# Patient Record
Sex: Female | Born: 1960 | Race: White | Hispanic: No | Marital: Married | Smoking: Never smoker
Health system: Southern US, Community
[De-identification: ages and names within clinical notes are randomized; demographics above are authoritative.]

## PROBLEM LIST (undated history)

## (undated) DIAGNOSIS — N950 Postmenopausal bleeding: Secondary | ICD-10-CM

## (undated) DIAGNOSIS — L719 Rosacea, unspecified: Secondary | ICD-10-CM

## (undated) DIAGNOSIS — L570 Actinic keratosis: Secondary | ICD-10-CM

## (undated) DIAGNOSIS — Q445 Other congenital malformations of bile ducts: Secondary | ICD-10-CM

## (undated) DIAGNOSIS — K828 Other specified diseases of gallbladder: Secondary | ICD-10-CM

## (undated) DIAGNOSIS — R9389 Abnormal findings on diagnostic imaging of other specified body structures: Secondary | ICD-10-CM

## (undated) DIAGNOSIS — E079 Disorder of thyroid, unspecified: Secondary | ICD-10-CM

## (undated) DIAGNOSIS — K648 Other hemorrhoids: Secondary | ICD-10-CM

## (undated) DIAGNOSIS — E039 Hypothyroidism, unspecified: Secondary | ICD-10-CM

## (undated) DIAGNOSIS — Z973 Presence of spectacles and contact lenses: Secondary | ICD-10-CM

## (undated) DIAGNOSIS — M8580 Other specified disorders of bone density and structure, unspecified site: Secondary | ICD-10-CM

## (undated) DIAGNOSIS — R011 Cardiac murmur, unspecified: Secondary | ICD-10-CM

## (undated) DIAGNOSIS — N809 Endometriosis, unspecified: Secondary | ICD-10-CM

## (undated) DIAGNOSIS — E785 Hyperlipidemia, unspecified: Secondary | ICD-10-CM

## (undated) HISTORY — DX: Actinic keratosis: L57.0

## (undated) HISTORY — PX: FRACTURE SURGERY: SHX138

## (undated) HISTORY — DX: Other specified disorders of bone density and structure, unspecified site: M85.80

## (undated) HISTORY — DX: Endometriosis, unspecified: N80.9

## (undated) HISTORY — PX: OOPHORECTOMY: SHX86

## (undated) HISTORY — DX: Other hemorrhoids: K64.8

## (undated) HISTORY — DX: Other specified diseases of gallbladder: K82.8

## (undated) HISTORY — DX: Hypothyroidism, unspecified: E03.9

## (undated) HISTORY — DX: Cardiac murmur, unspecified: R01.1

## (undated) HISTORY — PX: APPENDECTOMY: SHX54

## (undated) HISTORY — PX: BREAST CYST ASPIRATION: SHX578

## (undated) HISTORY — PX: LAPAROSCOPIC SALPINGO OOPHERECTOMY: SHX5927

## (undated) HISTORY — DX: Disorder of thyroid, unspecified: E07.9

## (undated) HISTORY — DX: Hyperlipidemia, unspecified: E78.5

## (undated) SURGERY — Surgical Case
Anesthesia: *Unknown

---

## 2000-11-06 HISTORY — PX: LAPAROSCOPIC SALPINGO OOPHERECTOMY: SHX5927

## 2010-02-20 ENCOUNTER — Emergency Department: Payer: Self-pay | Admitting: Emergency Medicine

## 2010-02-22 ENCOUNTER — Ambulatory Visit: Payer: Self-pay | Admitting: Specialist

## 2010-02-24 ENCOUNTER — Ambulatory Visit: Payer: Self-pay | Admitting: Specialist

## 2010-04-15 ENCOUNTER — Ambulatory Visit: Payer: Self-pay | Admitting: Unknown Physician Specialty

## 2010-11-06 HISTORY — PX: ORIF WRIST FRACTURE: SHX2133

## 2013-02-24 ENCOUNTER — Telehealth: Payer: Self-pay | Admitting: *Deleted

## 2013-02-24 MED ORDER — LEVOTHYROXINE SODIUM 50 MCG PO TABS: 50.0000 ug | ORAL_TABLET | Freq: Every day | ORAL | Status: DC

## 2013-02-24 NOTE — Telephone Encounter (Signed)
tsh is due in november

## 2013-02-24 NOTE — Telephone Encounter (Signed)
Rx Refilled  

## 2013-02-24 NOTE — Telephone Encounter (Signed)
Pt aware per vm.  

## 2013-02-24 NOTE — Telephone Encounter (Signed)
Ok to pick up synthroid.

## 2013-03-04 ENCOUNTER — Telehealth: Payer: Self-pay | Admitting: Family Medicine

## 2013-03-04 MED ORDER — LEVOTHYROXINE SODIUM 50 MCG PO TABS: 50.0000 ug | ORAL_TABLET | Freq: Every day | ORAL | Status: DC

## 2013-03-04 NOTE — Telephone Encounter (Signed)
rx sent again

## 2013-10-09 ENCOUNTER — Telehealth: Payer: Self-pay | Admitting: Family Medicine

## 2013-10-09 NOTE — Telephone Encounter (Signed)
Pt scheduled an apt on Jan 15 to get her CPE completed and she is wondering if she can have to lab papers sent to her by mail if you can call her at work and let her know if that is okay because she works for [employer] and can get it done there Call back [phone]

## 2013-10-10 NOTE — Telephone Encounter (Signed)
Done, mailed and pt aware

## 2013-11-20 ENCOUNTER — Ambulatory Visit (INDEPENDENT_AMBULATORY_CARE_PROVIDER_SITE_OTHER): Payer: Managed Care, Other (non HMO) | Admitting: Family Medicine

## 2013-11-20 ENCOUNTER — Encounter: Payer: Self-pay | Admitting: Family Medicine

## 2013-11-20 VITALS — BP 100/68 | HR 74 | Temp 97.1°F | Resp 14 | Ht 62.0 in | Wt 120.0 lb

## 2013-11-20 DIAGNOSIS — E039 Hypothyroidism, unspecified: Secondary | ICD-10-CM | POA: Insufficient documentation

## 2013-11-20 DIAGNOSIS — K828 Other specified diseases of gallbladder: Secondary | ICD-10-CM | POA: Insufficient documentation

## 2013-11-20 DIAGNOSIS — Z0000 Encounter for general adult medical examination without abnormal findings: Secondary | ICD-10-CM

## 2013-11-20 DIAGNOSIS — K648 Other hemorrhoids: Secondary | ICD-10-CM | POA: Insufficient documentation

## 2013-11-20 NOTE — Progress Notes (Signed)
Subjective:    Patient ID: Natasha Hawkins, female    DOB: 1961/07/01, 53 y.o.   MRN: [id number]  HPI Patient is a very pleasant 53 year old white female who presents today for complete physical exam. She gets her Pap smears with her gynecologist. That was performed this year and was normal. She also had a mammogram at her gynecologist which was normal. She had were drawn at an outside lab. This included a CBC, CMP, TSH are within normal limits. Her fasting lipid panel was significant for total cholesterol of 2:30, triglycerides 77, HDL 68, and LDL of 147. Her mammogram was performed in 2011 and was normal. She has had her flu shot. Her tetanus shot was given in 2011. Past Medical History  Diagnosis Date  . Hypothyroidism   . Hyperlipidemia   . Biliary dyskinesia   . Internal hemorrhoids    .sph Current Outpatient Prescriptions on File Prior to Visit  Medication Sig Dispense Refill  . levothyroxine (SYNTHROID, LEVOTHROID) 50 MCG tablet Take 1 tablet (50 mcg total) by mouth daily.  90 tablet  3   No current facility-administered medications on file prior to visit.   No Known Allergies History   Social History  . Marital Status: Married    Spouse Name: N/A    Number of Children: N/A  . Years of Education: N/A   Occupational History  . Not on file.   Social History Main Topics  . Smoking status: Never Smoker   . Smokeless tobacco: Not on file  . Alcohol Use: Yes     Comment: Social  . Drug Use: No  . Sexual Activity: Yes     Comment: Married to [employer], works at [employer]   Other Topics Concern  . Not on file   Social History Narrative  . No narrative on file   Family History  Problem Relation Age of Onset  . Cancer Mother     lung  . Intracerebral hemorrhage Father   . Heart disease Father   . Diabetes Father   . Kidney disease Father   . Cancer Paternal Aunt     colon      Review of Systems  All other systems reviewed and are negative.       Objective:   Physical Exam  Vitals reviewed. Constitutional: She is oriented to person, place, and time. She appears well-developed and well-nourished. No distress.  HENT:  Head: Normocephalic and atraumatic.  Right Ear: External ear normal.  Left Ear: External ear normal.  Nose: Nose normal.  Mouth/Throat: Oropharynx is clear and moist. No oropharyngeal exudate.  Eyes: Conjunctivae and EOM are normal. Pupils are equal, round, and reactive to light. Right eye exhibits no discharge. Left eye exhibits no discharge. No scleral icterus.  Neck: Normal range of motion. Neck supple. No JVD present. No tracheal deviation present. No thyromegaly present.  Cardiovascular: Normal rate, regular rhythm, normal heart sounds and intact distal pulses.  Exam reveals no gallop and no friction rub.   No murmur heard. Pulmonary/Chest: Effort normal and breath sounds normal. No stridor. No respiratory distress. She has no wheezes. She has no rales. She exhibits no tenderness.  Abdominal: Soft. Bowel sounds are normal. She exhibits no distension and no mass. There is no tenderness. There is no rebound and no guarding.  Musculoskeletal: Normal range of motion. She exhibits no edema and no tenderness.  Lymphadenopathy:    She has no cervical adenopathy.  Neurological: She is alert and oriented to person, place, and  time. She has normal reflexes. She displays normal reflexes. No cranial nerve deficit. She exhibits normal muscle tone. Coordination normal.  Skin: Skin is warm. No rash noted. She is not diaphoretic. No erythema. No pallor.  Psychiatric: She has a normal mood and affect. Her behavior is normal. Judgment and thought content normal.          Assessment & Plan:  1. Routine general medical examination at a health care facility Patient's physical exam is completely normal. I recommended she discontinue consumption of red meat to work to try to drive her LDL below 341. I would not recommend medication at this time.  Otherwise her immunizations and cancer screening are up to date. Irregular anticipatory guidance was provided. Recheck her fasting lipid panel in 6 months.

## 2013-12-11 ENCOUNTER — Encounter: Payer: Self-pay | Admitting: Family Medicine

## 2014-02-04 ENCOUNTER — Other Ambulatory Visit: Payer: Self-pay | Admitting: Family Medicine

## 2014-05-21 ENCOUNTER — Encounter: Payer: Self-pay | Admitting: Family Medicine

## 2014-05-21 ENCOUNTER — Telehealth: Payer: Self-pay | Admitting: Family Medicine

## 2014-05-21 NOTE — Telephone Encounter (Signed)
Mailed orders for CMP, Lipid and TSH

## 2014-05-21 NOTE — Telephone Encounter (Signed)
Message copied by Alyson Locket on Thu May 21, 2014  9:50 AM ------      Message from: Devoria Glassing      Created: Wed May 20, 2014 10:49 AM       [phone]      Patient works at [employer] and it is time for her to get her cholesterol and thyroid lab work done would like to know if we could send her the form to do this at [facility].       ------

## 2014-12-07 ENCOUNTER — Telehealth: Payer: Self-pay | Admitting: Family Medicine

## 2014-12-07 NOTE — Telephone Encounter (Signed)
Patient is coming in on march 1st for her cpe, and since she works for [employer] would like to know if we can send orders to them  [phone]

## 2014-12-08 NOTE — Telephone Encounter (Signed)
Orders mailed to pt.

## 2015-01-05 ENCOUNTER — Ambulatory Visit (INDEPENDENT_AMBULATORY_CARE_PROVIDER_SITE_OTHER): Payer: Managed Care, Other (non HMO) | Admitting: Family Medicine

## 2015-01-05 ENCOUNTER — Encounter: Payer: Self-pay | Admitting: Family Medicine

## 2015-01-05 VITALS — BP 108/60 | HR 60 | Temp 98.3°F | Resp 16 | Ht 62.0 in | Wt 116.0 lb

## 2015-01-05 DIAGNOSIS — Z0000 Encounter for general adult medical examination without abnormal findings: Secondary | ICD-10-CM

## 2015-01-05 MED ORDER — LEVOTHYROXINE SODIUM 50 MCG PO TABS: 50.0000 ug | ORAL_TABLET | Freq: Every day | ORAL | Status: DC

## 2015-01-05 NOTE — Progress Notes (Signed)
Subjective:    Patient ID: Natasha Hawkins, female    DOB: 1961-04-18, 54 y.o.   MRN: [id number]  HPI Patient is a very pleasant 54 year old white female. Her last colonoscopy was 5 years ago. It was performed in [place] under the care of Dr. Vira Agar. It was normal and she is not due again for 5 years. Her mammogram is up-to-date. Her Pap smear was performed last year by her gynecologist. Her tetanus vaccine and flu shot are up-to-date. Most recent lab work shows a normal TSH, normal CBC, normal CMP. LDL cholesterol is elevated at 145 but HDL cholesterol is outstanding at 72. Triglycerides were in the 80s. Past Medical History  Diagnosis Date  . Hypothyroidism   . Hyperlipidemia   . Biliary dyskinesia   . Internal hemorrhoids    Past Surgical History  Procedure Laterality Date  . Laparoscopic salpingo oopherectomy Left   . Appendectomy     Current Outpatient Prescriptions on File Prior to Visit  Medication Sig Dispense Refill  . SYNTHROID 50 MCG tablet Take 1 tablet by mouth  daily 90 tablet 3   No current facility-administered medications on file prior to visit.   No Known Allergies History   Social History  . Marital Status: Married    Spouse Name: N/A  . Number of Children: N/A  . Years of Education: N/A   Occupational History  . Not on file.   Social History Main Topics  . Smoking status: Never Smoker   . Smokeless tobacco: Not on file  . Alcohol Use: Yes     Comment: Social  . Drug Use: No  . Sexual Activity: Yes     Comment: Married to [employer], works at [employer]   Other Topics Concern  . Not on file   Social History Narrative   Family History  Problem Relation Age of Onset  . Cancer Mother     lung  . Intracerebral hemorrhage Father   . Heart disease Father   . Diabetes Father   . Kidney disease Father   . Cancer Paternal Aunt     colon      Review of Systems  All other systems reviewed and are negative.      Objective:   Physical Exam    Constitutional: She is oriented to person, place, and time. She appears well-developed and well-nourished. No distress.  HENT:  Head: Normocephalic and atraumatic.  Right Ear: External ear normal.  Left Ear: External ear normal.  Nose: Nose normal.  Mouth/Throat: Oropharynx is clear and moist. No oropharyngeal exudate.  Eyes: Conjunctivae and EOM are normal. Pupils are equal, round, and reactive to light. Right eye exhibits no discharge. Left eye exhibits no discharge. No scleral icterus.  Neck: Normal range of motion. Neck supple. No JVD present. No tracheal deviation present. No thyromegaly present.  Cardiovascular: Normal rate, regular rhythm, normal heart sounds and intact distal pulses.  Exam reveals no gallop and no friction rub.   No murmur heard. Pulmonary/Chest: Effort normal and breath sounds normal. No stridor. No respiratory distress. She has no wheezes. She has no rales. She exhibits no tenderness.  Abdominal: Soft. Bowel sounds are normal. She exhibits no distension and no mass. There is no tenderness. There is no rebound and no guarding.  Musculoskeletal: Normal range of motion. She exhibits no edema or tenderness.  Lymphadenopathy:    She has no cervical adenopathy.  Neurological: She is alert and oriented to person, place, and time. She has normal reflexes.  She displays normal reflexes. No cranial nerve deficit. She exhibits normal muscle tone. Coordination normal.  Skin: Skin is warm. No rash noted. She is not diaphoretic. No erythema. No pallor.  Psychiatric: She has a normal mood and affect. Her behavior is normal. Judgment and thought content normal.  Vitals reviewed.         Assessment & Plan:  Routine general medical examination at a health care facility  Physical exam is outstanding. I did recommend the patient at 2000 mg a day of omega-3 fatty acids. I will switch the patient to generic levothyroxine and safer money. Recheck TSH in 2 months. Cancer screening is  up-to-date immunizations are up-to-date. Follow-up in one year or as needed.

## 2015-01-27 ENCOUNTER — Telehealth: Payer: Self-pay | Admitting: Family Medicine

## 2015-01-27 DIAGNOSIS — E039 Hypothyroidism, unspecified: Secondary | ICD-10-CM

## 2015-01-27 MED ORDER — LEVOXYL 50 MCG PO TABS: 50.0000 ug | ORAL_TABLET | Freq: Every day | ORAL | Status: DC

## 2015-01-27 NOTE — Telephone Encounter (Signed)
Pt having problem with insurance and mail order pharmacy.  Can get Levoxyl 50 mcg cheaper at [facility] in [place] and would like to switch to Levoxyl and have new RX sent to [facility].  Pt is calling [facility] and canceling her service with them.  New Rx to pharmacy.

## 2015-01-27 NOTE — Telephone Encounter (Signed)
Patient is calling to speak with someone regarding her levothyroxine  Please call her at [phone] [facility] [place]

## 2015-01-28 NOTE — Telephone Encounter (Signed)
[place] with [facility].

## 2015-02-01 ENCOUNTER — Encounter: Payer: Self-pay | Admitting: Family Medicine

## 2015-04-22 ENCOUNTER — Other Ambulatory Visit: Payer: Self-pay | Admitting: Family Medicine

## 2015-04-23 LAB — TSH: TSH: 2.15 u[IU]/mL (ref 0.450–4.500)

## 2015-04-26 ENCOUNTER — Encounter: Payer: Self-pay | Admitting: Family Medicine

## 2015-05-28 ENCOUNTER — Ambulatory Visit: Payer: Managed Care, Other (non HMO) | Admitting: *Deleted

## 2015-05-28 ENCOUNTER — Encounter: Payer: Self-pay | Admitting: *Deleted

## 2015-05-28 ENCOUNTER — Ambulatory Visit
Admission: RE | Admit: 2015-05-28 | Discharge: 2015-05-28 | Disposition: A | Discharge status: Home / Self Care | Payer: Managed Care, Other (non HMO) | Source: Ambulatory Visit | Attending: Unknown Physician Specialty | Admitting: Unknown Physician Specialty

## 2015-05-28 ENCOUNTER — Encounter
Admission: RE | Disposition: A | Discharge status: Home / Self Care | Payer: Self-pay | Source: Ambulatory Visit | Attending: Unknown Physician Specialty

## 2015-05-28 DIAGNOSIS — Z8371 Family history of colonic polyps: Secondary | ICD-10-CM | POA: Insufficient documentation

## 2015-05-28 DIAGNOSIS — E785 Hyperlipidemia, unspecified: Secondary | ICD-10-CM | POA: Diagnosis not present

## 2015-05-28 DIAGNOSIS — Z79899 Other long term (current) drug therapy: Secondary | ICD-10-CM | POA: Diagnosis not present

## 2015-05-28 DIAGNOSIS — Z1211 Encounter for screening for malignant neoplasm of colon: Secondary | ICD-10-CM | POA: Insufficient documentation

## 2015-05-28 DIAGNOSIS — E039 Hypothyroidism, unspecified: Secondary | ICD-10-CM | POA: Insufficient documentation

## 2015-05-28 DIAGNOSIS — K640 First degree hemorrhoids: Secondary | ICD-10-CM | POA: Insufficient documentation

## 2015-05-28 HISTORY — PX: COLONOSCOPY WITH PROPOFOL: SHX5780

## 2015-05-28 SURGERY — COLONOSCOPY WITH PROPOFOL
Anesthesia: General

## 2015-05-28 MED ORDER — PROPOFOL INFUSION 10 MG/ML OPTIME
INTRAVENOUS | Status: DC | PRN
  Administered 2015-05-28: 120 ug/kg/min via INTRAVENOUS

## 2015-05-28 MED ORDER — SODIUM CHLORIDE 0.9 % IV SOLN: INTRAVENOUS | Status: DC

## 2015-05-28 MED ORDER — SODIUM CHLORIDE 0.9 % IV SOLN
INTRAVENOUS | Status: DC
  Administered 2015-05-28 (×2): via INTRAVENOUS

## 2015-05-28 MED ORDER — FENTANYL CITRATE (PF) 100 MCG/2ML IJ SOLN
INTRAMUSCULAR | Status: DC | PRN
  Administered 2015-05-28: 50 ug via INTRAVENOUS

## 2015-05-28 MED ORDER — MIDAZOLAM HCL 2 MG/2ML IJ SOLN
INTRAMUSCULAR | Status: DC | PRN
  Administered 2015-05-28: 1 mg via INTRAVENOUS

## 2015-05-28 NOTE — H&P (Signed)
Primary Care Physician:  Odette Fraction, MD Primary Gastroenterologist:  Dr. Vira Agar  Pre-Procedure History & Physical: HPI:  HIEN CUNLIFFE is a 54 y.o. female is here for an colonoscopy.   Past Medical History  Diagnosis Date  . Hypothyroidism   . Hyperlipidemia   . Biliary dyskinesia   . Internal hemorrhoids     Past Surgical History  Procedure Laterality Date  . Laparoscopic salpingo oopherectomy Left   . Appendectomy      Prior to Admission medications   Medication Sig Start Date End Date Taking? Authorizing Provider  LEVOXYL 50 MCG tablet Take 1 tablet (50 mcg total) by mouth daily before breakfast. 01/27/15  Yes Susy Frizzle, MD    Allergies as of 04/16/2015  . (No Known Allergies)    Family History  Problem Relation Age of Onset  . Cancer Mother     lung  . Intracerebral hemorrhage Father   . Heart disease Father   . Diabetes Father   . Kidney disease Father   . Cancer Paternal Aunt     colon    History   Social History  . Marital Status: Married    Spouse Name: N/A  . Number of Children: N/A  . Years of Education: N/A   Occupational History  . Not on file.   Social History Main Topics  . Smoking status: Never Smoker   . Smokeless tobacco: Not on file  . Alcohol Use: Yes     Comment: Social  . Drug Use: No  . Sexual Activity: Yes     Comment: Married to [employer], works at [employer]   Other Topics Concern  . Not on file   Social History Narrative    Review of Systems: See HPI, otherwise negative ROS  Physical Exam: BP 116/78 mmHg  Pulse 71  Temp(Src) 98.4 F (36.9 C) (Tympanic)  Resp 16  Ht 5\' 2"  (1.575 m)  Wt 52.617 kg (116 lb)  BMI 21.21 kg/m2  SpO2 100% General:   Alert,  pleasant and cooperative in NAD Head:  Normocephalic and atraumatic. Neck:  Supple; no masses or thyromegaly. Lungs:  Clear throughout to auscultation.    Heart:  Regular rate and rhythm. Abdomen:  Soft, nontender and nondistended. Normal bowel sounds,  without guarding, and without rebound.   Neurologic:  Alert and  oriented x4;  grossly normal neurologically.  Impression/Plan: JAHZARIA VARY is here for an colonoscopy to be performed for family history of colon polyps in mother  Risks, benefits, limitations, and alternatives regarding  colonoscopy have been reviewed with the patient.  Questions have been answered.  All parties agreeable.   Gaylyn Cheers, MD  05/28/2015, 3:19 PM   Primary Care Physician:  Odette Fraction, MD Primary Gastroenterologist:  Dr. Vira Agar  Pre-Procedure History & Physical: HPI:  MYCHAL DURIO is a 54 y.o. female is here for an colonoscopy.   Past Medical History  Diagnosis Date  . Hypothyroidism   . Hyperlipidemia   . Biliary dyskinesia   . Internal hemorrhoids     Past Surgical History  Procedure Laterality Date  . Laparoscopic salpingo oopherectomy Left   . Appendectomy      Prior to Admission medications   Medication Sig Start Date End Date Taking? Authorizing Provider  LEVOXYL 50 MCG tablet Take 1 tablet (50 mcg total) by mouth daily before breakfast. 01/27/15  Yes Susy Frizzle, MD    Allergies as of 04/16/2015  . (No Known Allergies)  Family History  Problem Relation Age of Onset  . Cancer Mother     lung  . Intracerebral hemorrhage Father   . Heart disease Father   . Diabetes Father   . Kidney disease Father   . Cancer Paternal Aunt     colon    History   Social History  . Marital Status: Married    Spouse Name: N/A  . Number of Children: N/A  . Years of Education: N/A   Occupational History  . Not on file.   Social History Main Topics  . Smoking status: Never Smoker   . Smokeless tobacco: Not on file  . Alcohol Use: Yes     Comment: Social  . Drug Use: No  . Sexual Activity: Yes     Comment: Married to [employer], works at [employer]   Other Topics Concern  . Not on file   Social History Narrative    Review of Systems: See HPI, otherwise negative  ROS  Physical Exam: BP 116/78 mmHg  Pulse 71  Temp(Src) 98.4 F (36.9 C) (Tympanic)  Resp 16  Ht 5\' 2"  (1.575 m)  Wt 52.617 kg (116 lb)  BMI 21.21 kg/m2  SpO2 100% General:   Alert,  pleasant and cooperative in NAD Head:  Normocephalic and atraumatic. Neck:  Supple; no masses or thyromegaly. Lungs:  Clear throughout to auscultation.    Heart:  Regular rate and rhythm. Abdomen:  Soft, nontender and nondistended. Normal bowel sounds, without guarding, and without rebound.   Neurologic:  Alert and  oriented x4;  grossly normal neurologically.  Impression/Plan: SOUMYA COLSON is here for an colonoscopy to be performed for family history of colon polyps  Risks, benefits, limitations, and alternatives regarding  colonoscopy have been reviewed with the patient.  Questions have been answered.  All parties agreeable.   Gaylyn Cheers, MD  05/28/2015, 3:19 PM

## 2015-05-28 NOTE — Anesthesia Postprocedure Evaluation (Signed)
  Anesthesia Post-op Note  Patient: Natasha Hawkins  Procedure(s) Performed: Procedure(s): COLONOSCOPY WITH PROPOFOL (N/A)  Anesthesia type:General  Patient location: PACU  Post pain: Pain level controlled  Post assessment: Post-op Vital signs reviewed, Patient's Cardiovascular Status Stable, Respiratory Function Stable, Patent Airway and No signs of Nausea or vomiting  Post vital signs: Reviewed and stable  Last Vitals:  Filed Vitals:   05/28/15 1633  BP: 117/99  Pulse: 69  Temp:   Resp: 17    Level of consciousness: awake, alert  and patient cooperative  Complications: No apparent anesthesia complications

## 2015-05-28 NOTE — Op Note (Signed)
[facility] Patient Name: Natasha Hawkins Procedure Date: 05/28/2015 3:00 PM MRN: [id number] Account #: [id number] Date of Birth: 04/20/1961 Admit Type: Outpatient Age: 54 Room: [facility] ENDO ROOM 1 Gender: Female Note Status: Finalized Procedure:         Colonoscopy Indications:       Family history of colonic polyps in a first-degree relative Providers:         Manya Silvas, MD Referring MD:      Cammie Mcgee. Dennard Schaumann, MD (Referring MD) Medicines:         Propofol per Anesthesia Complications:     No immediate complications. Procedure:         Pre-Anesthesia Assessment:                    - After reviewing the risks and benefits, the patient was                     deemed in satisfactory condition to undergo the procedure.                    After obtaining informed consent, the colonoscope was                     passed under direct vision. Throughout the procedure, the                     patient's blood pressure, pulse, and oxygen saturations                     were monitored continuously. The Colonoscope was                     introduced through the anus and advanced to the the cecum,                     identified by appendiceal orifice and ileocecal valve. The                     colonoscopy was somewhat difficult due to significant                     looping. Successful completion of the procedure was aided                     by applying abdominal pressure. The patient tolerated the                     procedure well. Findings:      Internal hemorrhoids were found during endoscopy. The hemorrhoids were       small and Grade I (internal hemorrhoids that do not prolapse).      The exam was otherwise without abnormality. Impression:        - Internal hemorrhoids.                    - The examination was otherwise normal.                    - No specimens collected. Recommendation:    - Repeat colonoscopy in 5 years for screening  purposes. Manya Silvas, MD 05/28/2015 3:57:42 PM This report has been signed electronically. Number of Addenda: 0 Note Initiated On: 05/28/2015 3:00 PM Scope Withdrawal Time: 0 hours 12 minutes 17 seconds  Total Procedure Duration: 0  hours 28 minutes 21 seconds       [facility]

## 2015-05-28 NOTE — Transfer of Care (Signed)
Immediate Anesthesia Transfer of Care Note  Patient: Natasha Hawkins  Procedure(s) Performed: Procedure(s): COLONOSCOPY WITH PROPOFOL (N/A)  Patient Location: PACU and Endoscopy Unit  Anesthesia Type:General  Level of Consciousness: awake, alert  and oriented  Airway & Oxygen Therapy: Patient Spontanous Breathing and Patient connected to nasal cannula oxygen  Post-op Assessment: Report given to RN and Post -op Vital signs reviewed and stable  Post vital signs: Reviewed and stable  Last Vitals:  Filed Vitals:   05/28/15 1603  BP: 107/60  Pulse: 68  Temp: 37 C  Resp: 17    Complications: No apparent anesthesia complications and Patient re-intubated

## 2015-05-28 NOTE — Anesthesia Preprocedure Evaluation (Signed)
Anesthesia Evaluation  Patient identified by MRN, date of birth, ID band Patient awake    Reviewed: Allergy & Precautions, NPO status , Patient's Chart, lab work & pertinent test results  Airway Mallampati: I  TM Distance: >3 FB Neck ROM: Full    Dental  (+) Teeth Intact   Pulmonary    Pulmonary exam normal       Cardiovascular Exercise Tolerance: Good negative cardio ROS Normal cardiovascular exam    Neuro/Psych    GI/Hepatic   Endo/Other  Hypothyroidism   Renal/GU      Musculoskeletal   Abdominal (+)  Abdomen: soft.    Peds  Hematology   Anesthesia Other Findings   Reproductive/Obstetrics                             Anesthesia Physical Anesthesia Plan  ASA: I  Anesthesia Plan: General   Post-op Pain Management:    Induction: Intravenous  Airway Management Planned: Nasal Cannula  Additional Equipment:   Intra-op Plan:   Post-operative Plan:   Informed Consent: I have reviewed the patients History and Physical, chart, labs and discussed the procedure including the risks, benefits and alternatives for the proposed anesthesia with the patient or authorized representative who has indicated his/her understanding and acceptance.     Plan Discussed with: CRNA  Anesthesia Plan Comments:         Anesthesia Quick Evaluation

## 2015-05-31 ENCOUNTER — Encounter: Payer: Self-pay | Admitting: Unknown Physician Specialty

## 2015-10-28 LAB — HM MAMMOGRAPHY

## 2015-11-22 ENCOUNTER — Encounter: Payer: Self-pay | Admitting: *Deleted

## 2015-12-23 ENCOUNTER — Telehealth: Payer: Self-pay | Admitting: Family Medicine

## 2015-12-23 NOTE — Telephone Encounter (Signed)
Pt has a CPE w/ Dr. Dennard Schaumann on 3/13 and would like for him to mail the lab orders to her home so that he can have her fasting labs drawn @ [facility]. Ph: [phone]

## 2015-12-24 NOTE — Telephone Encounter (Signed)
Ordered written and placed in mail

## 2016-01-17 ENCOUNTER — Ambulatory Visit (INDEPENDENT_AMBULATORY_CARE_PROVIDER_SITE_OTHER): Payer: Managed Care, Other (non HMO) | Admitting: Family Medicine

## 2016-01-17 ENCOUNTER — Encounter: Payer: Self-pay | Admitting: Family Medicine

## 2016-01-17 VITALS — BP 100/74 | HR 64 | Temp 98.1°F | Resp 16 | Ht 62.0 in | Wt 122.0 lb

## 2016-01-17 DIAGNOSIS — E785 Hyperlipidemia, unspecified: Secondary | ICD-10-CM

## 2016-01-17 DIAGNOSIS — E039 Hypothyroidism, unspecified: Secondary | ICD-10-CM

## 2016-01-17 DIAGNOSIS — Z0000 Encounter for general adult medical examination without abnormal findings: Secondary | ICD-10-CM | POA: Diagnosis not present

## 2016-01-17 MED ORDER — LEVOXYL 50 MCG PO TABS: 50.0000 ug | ORAL_TABLET | Freq: Every day | ORAL | Status: DC

## 2016-01-17 MED ORDER — PRAVASTATIN SODIUM 20 MG PO TABS: 20.0000 mg | ORAL_TABLET | Freq: Every day | ORAL | Status: DC

## 2016-01-17 NOTE — Progress Notes (Signed)
Subjective:    Patient ID: Natasha Hawkins, female    DOB: 08/28/1961, 55 y.o.   MRN: [id number]  HPI  Patient is a very pleasant 55 year old white female.  Patient had a colonoscopy in July 2016 which was normal.  Her mammogram is up-to-date. Her Pap smear was performed last year by her gynecologist. Her tetanus vaccine and flu shot are up-to-date.  Past Medical History  Diagnosis Date  . Hypothyroidism   . Hyperlipidemia   . Biliary dyskinesia   . Internal hemorrhoids    Past Surgical History  Procedure Laterality Date  . Laparoscopic salpingo oopherectomy Left   . Appendectomy    . Colonoscopy with propofol N/A 05/28/2015    Procedure: COLONOSCOPY WITH PROPOFOL;  Surgeon: Manya Silvas, MD;  Location: [facility] ENDOSCOPY;  Service: Endoscopy;  Laterality: N/A;   Current Outpatient Prescriptions on File Prior to Visit  Medication Sig Dispense Refill  . LEVOXYL 50 MCG tablet Take 1 tablet (50 mcg total) by mouth daily before breakfast. 90 tablet 3   No current facility-administered medications on file prior to visit.   No Known Allergies Social History   Social History  . Marital Status: Married    Spouse Name: N/A  . Number of Children: N/A  . Years of Education: N/A   Occupational History  . Not on file.   Social History Main Topics  . Smoking status: Never Smoker   . Smokeless tobacco: Not on file  . Alcohol Use: Yes     Comment: Social  . Drug Use: No  . Sexual Activity: Yes     Comment: Married to [employer], works at [employer]   Other Topics Concern  . Not on file   Social History Narrative   Family History  Problem Relation Age of Onset  . Cancer Mother     lung  . Intracerebral hemorrhage Father   . Heart disease Father   . Diabetes Father   . Kidney disease Father   . Cancer Paternal Aunt     colon      Review of Systems  All other systems reviewed and are negative.      Objective:   Physical Exam  Constitutional: She is oriented to person,  place, and time. She appears well-developed and well-nourished. No distress.  HENT:  Head: Normocephalic and atraumatic.  Right Ear: External ear normal.  Left Ear: External ear normal.  Nose: Nose normal.  Mouth/Throat: Oropharynx is clear and moist. No oropharyngeal exudate.  Eyes: Conjunctivae and EOM are normal. Pupils are equal, round, and reactive to light. Right eye exhibits no discharge. Left eye exhibits no discharge. No scleral icterus.  Neck: Normal range of motion. Neck supple. No JVD present. No tracheal deviation present. No thyromegaly present.  Cardiovascular: Normal rate, regular rhythm, normal heart sounds and intact distal pulses.  Exam reveals no gallop and no friction rub.   No murmur heard. Pulmonary/Chest: Effort normal and breath sounds normal. No stridor. No respiratory distress. She has no wheezes. She has no rales. She exhibits no tenderness.  Abdominal: Soft. Bowel sounds are normal. She exhibits no distension and no mass. There is no tenderness. There is no rebound and no guarding.  Musculoskeletal: Normal range of motion. She exhibits no edema or tenderness.  Lymphadenopathy:    She has no cervical adenopathy.  Neurological: She is alert and oriented to person, place, and time. She has normal reflexes. No cranial nerve deficit. She exhibits normal muscle tone. Coordination normal.  Skin: Skin is warm. No rash noted. She is not diaphoretic. No erythema. No pallor.  Psychiatric: She has a normal mood and affect. Her behavior is normal. Judgment and thought content normal.  Vitals reviewed.      Assessment & Plan:  HLD (hyperlipidemia) - Plan: pravastatin (PRAVACHOL) 20 MG tablet  Hypothyroidism, unspecified hypothyroidism type - Plan: LEVOXYL 50 MCG tablet  Routine general medical examination at a health care facility  Physical exam is outstanding. Patient [facility] CBC was normal. CMP was normal. TSH was 2.85. Fasting lipid panel  was significant for a total cholesterol 261, triglycerides of 78, HDL cholesterol of 64, and LDL cholesterol of 181.  Begin pravastatin 20 mg by mouth daily and recheck fasting lipid panel and CMP in 3 months. Immunizations are up-to-date. Cancer screening is up-to-date.

## 2016-04-19 ENCOUNTER — Encounter: Payer: Self-pay | Admitting: Family Medicine

## 2016-04-19 ENCOUNTER — Telehealth: Payer: Self-pay | Admitting: Family Medicine

## 2016-04-19 NOTE — Telephone Encounter (Signed)
Orders done and mailed to patient.

## 2016-04-19 NOTE — Telephone Encounter (Signed)
Pt left a message requesting lab orders to have her 3 mo. Cholesterol check. She is an employee @ [employer] so she would like to have her labs drawn there.  [phone]

## 2016-10-16 ENCOUNTER — Ambulatory Visit (INDEPENDENT_AMBULATORY_CARE_PROVIDER_SITE_OTHER): Payer: Managed Care, Other (non HMO) | Admitting: Obstetrics and Gynecology

## 2016-10-16 ENCOUNTER — Encounter: Payer: Self-pay | Admitting: Obstetrics and Gynecology

## 2016-10-16 VITALS — BP 110/65 | HR 76 | Resp 14 | Ht 61.5 in | Wt 119.0 lb

## 2016-10-16 DIAGNOSIS — N941 Unspecified dyspareunia: Secondary | ICD-10-CM

## 2016-10-16 DIAGNOSIS — N952 Postmenopausal atrophic vaginitis: Secondary | ICD-10-CM

## 2016-10-16 DIAGNOSIS — Z01419 Encounter for gynecological examination (general) (routine) without abnormal findings: Secondary | ICD-10-CM

## 2016-10-16 DIAGNOSIS — Z124 Encounter for screening for malignant neoplasm of cervix: Secondary | ICD-10-CM | POA: Diagnosis not present

## 2016-10-16 MED ORDER — LIDOCAINE 5 % EX OINT: TOPICAL_OINTMENT | CUTANEOUS | 0 refills | Status: DC

## 2016-10-16 MED ORDER — ESTRADIOL 10 MCG VA TABS: ORAL_TABLET | VAGINAL | 0 refills | Status: DC

## 2016-10-16 NOTE — Patient Instructions (Signed)

## 2016-10-16 NOTE — Progress Notes (Signed)
55 y.o. G0P0 MarriedCaucasianF here for annual exam.  LMP in 2008.  She is c/o pain with intercourse, on entry. Not able to have intercourse even with lubricant. No baseline pain. No vaginal bleeding. No vasomotor symptoms.     No LMP recorded. Patient is postmenopausal.          Sexually active: Yes.    The current method of family planning is post menopausal status.    Exercising: Yes.    weights/ cardio  Smoker:  no  Health Maintenance: Pap:  12/ 2016 WNL per patient  History of abnormal Pap:  Yes, several years ago, no cervical surgery.  MMG:  10-28-15 WNL  Colonoscopy:  05-28-15 WNL repeat 5 yrs BMD:   Never TDaP:  09-26-12 Gardasil: N/A   reports that she has never smoked. She has never used smokeless tobacco. She reports that she drinks about 1.2 oz of alcohol per week . 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   Past Medical History:  Diagnosis Date  . Biliary dyskinesia   . Endometriosis   . Heart murmur   . Hyperlipidemia   . Hypothyroidism   . Internal hemorrhoids     Past Surgical History:  Procedure Laterality Date  . APPENDECTOMY    . COLONOSCOPY WITH PROPOFOL N/A 05/28/2015   Procedure: COLONOSCOPY WITH PROPOFOL;  Surgeon: Manya Silvas, MD;  Location: [facility] ENDOSCOPY;  Service: Endoscopy;  Laterality: N/A;  . LAPAROSCOPIC SALPINGO OOPHERECTOMY Left   . OOPHORECTOMY      Current Outpatient Prescriptions  Medication Sig Dispense Refill  . Calcium-Vitamin D (CALTRATE 600 PLUS-VIT D PO) Take by mouth.    . LEVOXYL 50 MCG tablet Take 1 tablet (50 mcg total) by mouth daily before breakfast. 90 tablet 3  . pravastatin (PRAVACHOL) 20 MG tablet Take 1 tablet (20 mg total) by mouth daily. 90 tablet 3   No current facility-administered medications for this visit.     Family History  Problem Relation Age of Onset  . Cancer Mother     lung  . Intracerebral hemorrhage  Father   . Heart disease Father   . Diabetes Father   . Kidney disease Father   . Cancer Maternal Aunt     colon  . Cancer Maternal Aunt     colon   . Pancreatic cancer Paternal Uncle   . Heart disease Maternal Grandfather   . Cancer Paternal Grandmother   . Pancreatic cancer Paternal Aunt     Review of Systems  Constitutional: Negative.   HENT: Negative.   Eyes: Negative.   Respiratory: Negative.   Cardiovascular: Negative.   Gastrointestinal: Negative.   Endocrine: Negative.   Genitourinary: Positive for dyspareunia.       Vaginal dryness  Musculoskeletal: Negative.   Skin: Negative.   Allergic/Immunologic: Negative.   Neurological: Negative.   Psychiatric/Behavioral: Negative.     Exam:   BP 110/65 (BP Location: Right Arm, Patient Position: Sitting, Cuff Size: Normal)   Pulse 76   Resp 14   Ht 5' 1.5" (1.562 m)   Wt 119 lb (54 kg)   BMI 22.12 kg/m   Weight change: @WEIGHTCHANGE @ Height:   Height: 5' 1.5" (156.2 cm)  Ht Readings from Last 3 Encounters:  10/16/16 5' 1.5" (1.562 m)  01/17/16 5\' 2"  (1.575 m)  05/28/15 5\' 2"  (1.575 m)    General appearance: alert, cooperative and appears stated  age Head: Normocephalic, without obvious abnormality, atraumatic Neck: no adenopathy, supple, symmetrical, trachea midline and thyroid normal to inspection and palpation Lungs: clear to auscultation bilaterally Cardiovascular: regular rate and rhythm Breasts: normal appearance, no masses or tenderness Heart: regular rate and rhythm Abdomen: soft, non-tender; bowel sounds normal; no masses,  no organomegaly Extremities: extremities normal, atraumatic, no cyanosis or edema Skin: Skin color, texture, turgor normal. No rashes or lesions Lymph nodes: Cervical, supraclavicular, and axillary nodes normal. No abnormal inguinal nodes palpated Neurologic: Grossly normal   Pelvic: External genitalia:  no lesions. Mild point tenderness in the vestibule at 2 o'clock only               Urethra:  normal appearing urethra with no masses, tenderness or lesions              Bartholins and Skenes: normal                 Vagina: normal appearing atrophic vagina with normal color and discharge, no lesions              Cervix: no lesions and stenotic               Bimanual Exam:  Uterus:  normal size, contour, position, consistency, mobility, non-tender              Adnexa: no mass, fullness, tenderness               Rectovaginal: Confirms               Anus:  normal sphincter tone, no lesions  Chaperone was present for exam.  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

## 2016-10-24 LAB — IPS PAP TEST WITH HPV

## 2016-11-14 ENCOUNTER — Encounter: Payer: Self-pay | Admitting: Obstetrics and Gynecology

## 2016-11-14 ENCOUNTER — Ambulatory Visit (INDEPENDENT_AMBULATORY_CARE_PROVIDER_SITE_OTHER): Payer: Managed Care, Other (non HMO) | Admitting: Obstetrics and Gynecology

## 2016-11-14 VITALS — BP 118/80 | HR 80 | Resp 16 | Wt 123.0 lb

## 2016-11-14 DIAGNOSIS — N94819 Vulvodynia, unspecified: Secondary | ICD-10-CM

## 2016-11-14 DIAGNOSIS — N952 Postmenopausal atrophic vaginitis: Secondary | ICD-10-CM | POA: Diagnosis not present

## 2016-11-14 NOTE — Progress Notes (Signed)
GYNECOLOGY  VISIT   HPI: 56 y.o.   Married  Caucasian  female   G0P0 with No LMP recorded. Patient is postmenopausal.   here for follow up Dyspareunia.  Last month she was started on vaginal estrogen, she already feels her vagina is more comfortable baseline. Hasn't been sexually active yet.    GYNECOLOGIC HISTORY: No LMP recorded. Patient is postmenopausal. Contraception:postmenopause  Menopausal hormone therapy: none         OB History    Gravida Para Term Preterm AB Living   0             SAB TAB Ectopic Multiple Live Births                     Patient Active Problem List   Diagnosis Date Noted  . Hypothyroidism   . Biliary dyskinesia   . Internal hemorrhoids     Past Medical History:  Diagnosis Date  . Biliary dyskinesia   . Endometriosis   . Heart murmur   . Hyperlipidemia   . Hypothyroidism   . Internal hemorrhoids     Past Surgical History:  Procedure Laterality Date  . APPENDECTOMY    . COLONOSCOPY WITH PROPOFOL N/A 05/28/2015   Procedure: COLONOSCOPY WITH PROPOFOL;  Surgeon: Manya Silvas, MD;  Location: [facility] ENDOSCOPY;  Service: Endoscopy;  Laterality: N/A;  . LAPAROSCOPIC SALPINGO OOPHERECTOMY Left   . OOPHORECTOMY      Current Outpatient Prescriptions  Medication Sig Dispense Refill  . Calcium-Vitamin D (CALTRATE 600 PLUS-VIT D PO) Take by mouth.    . Estradiol 10 MCG TABS vaginal tablet Place one tablet vaginally qhs x 1 week, then 1 tablet vaginally 2 x a week at hs 24 tablet 0  . LEVOXYL 50 MCG tablet Take 1 tablet (50 mcg total) by mouth daily before breakfast. 90 tablet 3  . lidocaine (XYLOCAINE) 5 % ointment Apply a pea sized amount topically 20 minutes prior to intercourse, then wipe it off just prior to intercourse 30 g 0  . pravastatin (PRAVACHOL) 20 MG tablet Take 1 tablet (20 mg total) by mouth daily. 90 tablet 3   No current facility-administered medications for this visit.      ALLERGIES: Patient has no known allergies.  Family  History  Problem Relation Age of Onset  . Cancer Mother     lung  . Intracerebral hemorrhage Father   . Heart disease Father   . Diabetes Father   . Kidney disease Father   . Cancer Maternal Aunt     colon  . Cancer Maternal Aunt     colon   . Pancreatic cancer Paternal Uncle   . Heart disease Maternal Grandfather   . Cancer Paternal Grandmother   . Pancreatic cancer Paternal Aunt     Social History   Social History  . Marital status: Married    Spouse name: N/A  . Number of children: N/A  . Years of education: N/A   Occupational History  . Not on file.   Social History Main Topics  . Smoking status: Never Smoker  . Smokeless tobacco: Never Used  . Alcohol use 1.2 oz/week    2 Standard drinks or equivalent per week     Comment: Social  . Drug use: No  . Sexual activity: Yes    Partners: Male    Birth control/ protection: Post-menopausal     Comment: Married to [employer], works at [employer]   Other Topics Concern  .  Not on file   Social History Narrative  . No narrative on file    Review of Systems  Constitutional: Negative.   HENT: Negative.   Eyes: Negative.   Respiratory: Negative.   Cardiovascular: Negative.   Gastrointestinal: Negative.   Genitourinary: Negative.   Musculoskeletal: Negative.   Skin: Negative.   Neurological: Negative.   Endo/Heme/Allergies: Negative.   Psychiatric/Behavioral: Negative.     PHYSICAL EXAMINATION:    BP 118/80 (BP Location: Right Arm, Patient Position: Sitting, Cuff Size: Normal)   Pulse 80   Resp 16   Wt 123 lb (55.8 kg)   BMI 22.86 kg/m     General appearance: alert, cooperative and appears stated age  Pelvic: External genitalia:  no lesions, minimally tender in the vestibule at 2 o'clock only (improved)              Urethra:  normal appearing urethra with no masses, tenderness or lesions              Bartholins and Skenes: normal                 Vagina: normal appearing vagina, mild atrophy, normal color and  discharge, no lesions   Able to comfortably insert one finger vaginally, 2 fingers only part way              Cervix: no lesions              Bimanual Exam:  Uterus:  normal size, contour, position, consistency, mobility, non-tender              Adnexa: no mass, fullness, tenderness               Chaperone was present for exam.  ASSESSMENT Vaginal atrophy, improving Dyspareunia, hasn't attempted intercourse yet. Vaginal introitus is tight Still mildly tender at the vestibule at 2 o'clock    PLAN Continue vaginal estrogen Use lidocaine ointment prior to sex as needed Use a lubricant with intercourse, she should control rate and depth of penetration Discussed the possibility of vaginal dilators, she will call if she feels she needs them   An After Visit Summary was printed and given to the patient.  10-15 minutes face to face time of which over 50% was spent in counseling.

## 2016-12-08 ENCOUNTER — Other Ambulatory Visit: Payer: Self-pay | Admitting: *Deleted

## 2016-12-08 ENCOUNTER — Other Ambulatory Visit: Payer: Self-pay | Admitting: Obstetrics and Gynecology

## 2016-12-08 ENCOUNTER — Encounter: Payer: Self-pay | Admitting: Obstetrics and Gynecology

## 2016-12-08 MED ORDER — ESTRADIOL 10 MCG VA TABS: ORAL_TABLET | VAGINAL | 3 refills | Status: DC

## 2016-12-08 NOTE — Telephone Encounter (Signed)
Script was sent

## 2016-12-08 NOTE — Telephone Encounter (Addendum)
See refill encounter created 12/08/16 to review with provider.

## 2016-12-08 NOTE — Telephone Encounter (Signed)
Medication refill request: Estradiol 10 MCG tabs Last AEX:  10/16/16 JJ Next AEX: 10/18/17 JJ Last MMG (if hormonal medication request): 10/28/15 [facility] Refill authorized: 10/16/16 #[id number]. Please advise. Thank you.

## 2016-12-08 NOTE — Telephone Encounter (Signed)
Dr. Talbert Nan -Adding MyChart message as seen below to refill request. Also forwarding FYI.   From [name] To [name], MD Sent 12/08/2016 9:16 AM  Hi Dr. Talbert Nan,  I just wanted to let you know that the ESTRADIOL along with the Lidocain is definitely helping my situation. So far so good! I did send your office a request to refill the ESTRADIOL because the label on my packet says NO REFILLS LEFT.  I know we talked about this and you were expecting the prescription to be good for a year. Seems pharmacy isn't interpreting correctly or something.   Thank you so much!  Rucha Duhamel [facility])

## 2016-12-28 ENCOUNTER — Telehealth: Payer: Self-pay | Admitting: Family Medicine

## 2016-12-28 ENCOUNTER — Encounter: Payer: Self-pay | Admitting: Family Medicine

## 2016-12-28 NOTE — Telephone Encounter (Signed)
Pt was scheduled for her yearly with Natasha Hawkins. Wants for you to send the order for labs in the mail so she can have them done at work since she works for [employer]. Please call her back.

## 2016-12-28 NOTE — Telephone Encounter (Signed)
Pt aware via mychart 

## 2016-12-28 NOTE — Telephone Encounter (Signed)
Orders mailed to pt.

## 2016-12-29 ENCOUNTER — Other Ambulatory Visit: Payer: Self-pay | Admitting: Family Medicine

## 2016-12-29 DIAGNOSIS — E785 Hyperlipidemia, unspecified: Secondary | ICD-10-CM

## 2017-01-15 ENCOUNTER — Other Ambulatory Visit: Payer: Self-pay | Admitting: Family Medicine

## 2017-01-15 DIAGNOSIS — E039 Hypothyroidism, unspecified: Secondary | ICD-10-CM

## 2017-01-22 ENCOUNTER — Other Ambulatory Visit: Payer: Self-pay

## 2017-01-22 ENCOUNTER — Encounter: Payer: Self-pay | Admitting: Family Medicine

## 2017-01-22 MED ORDER — ESTRADIOL 10 MCG VA TABS: ORAL_TABLET | VAGINAL | 3 refills | Status: DC

## 2017-01-22 NOTE — Telephone Encounter (Signed)
Medication refill request: Estradiol Last AEX:  10/16/16 JJ Next AEX: 10/18/17 JJ Last MMG (if hormonal medication request): 10/28/15 [facility] Refill authorized: 12/08/16 #[id number]. Patient request change in pharmacy. Correct pharmacy on file. Please advise. Thank you.

## 2017-01-30 ENCOUNTER — Encounter: Payer: Self-pay | Admitting: Family Medicine

## 2017-01-30 ENCOUNTER — Ambulatory Visit (INDEPENDENT_AMBULATORY_CARE_PROVIDER_SITE_OTHER): Payer: Managed Care, Other (non HMO) | Admitting: Family Medicine

## 2017-01-30 VITALS — BP 128/70 | HR 66 | Temp 98.0°F | Resp 14 | Ht 62.0 in | Wt 122.0 lb

## 2017-01-30 DIAGNOSIS — Z1231 Encounter for screening mammogram for malignant neoplasm of breast: Secondary | ICD-10-CM | POA: Diagnosis not present

## 2017-01-30 DIAGNOSIS — E7800 Pure hypercholesterolemia, unspecified: Secondary | ICD-10-CM | POA: Diagnosis not present

## 2017-01-30 DIAGNOSIS — Z0000 Encounter for general adult medical examination without abnormal findings: Secondary | ICD-10-CM

## 2017-01-30 DIAGNOSIS — Z1239 Encounter for other screening for malignant neoplasm of breast: Secondary | ICD-10-CM

## 2017-01-30 NOTE — Progress Notes (Signed)
Subjective:    Patient ID: Natasha Hawkins, female    DOB: June 19, 1961, 56 y.o.   MRN: [id number]  HPI Patient is a very pleasant 56 year old white female.  Patient had a colonoscopy in July 2016 which was normal.  Her mammogram is due. Her Pap smear is performed annually by her gynecologist.Her immunizations are up-to-date Past Medical History:  Diagnosis Date  . Biliary dyskinesia   . Endometriosis   . Heart murmur   . Hyperlipidemia   . Hypothyroidism   . Internal hemorrhoids    Past Surgical History:  Procedure Laterality Date  . APPENDECTOMY    . COLONOSCOPY WITH PROPOFOL N/A 05/28/2015   Procedure: COLONOSCOPY WITH PROPOFOL;  Surgeon: Manya Silvas, MD;  Location: [facility] ENDOSCOPY;  Service: Endoscopy;  Laterality: N/A;  . LAPAROSCOPIC SALPINGO OOPHERECTOMY Left   . OOPHORECTOMY     Current Outpatient Prescriptions on File Prior to Visit  Medication Sig Dispense Refill  . Estradiol 10 MCG TABS vaginal tablet 1 tablet vaginally 2 x a week at hs 24 tablet 3  . LEVOXYL 50 MCG tablet take 1 tablet by mouth once daily BEFORE BREAKFAST. 90 tablet 3  . lidocaine (XYLOCAINE) 5 % ointment Apply a pea sized amount topically 20 minutes prior to intercourse, then wipe it off just prior to intercourse 30 g 0  . pravastatin (PRAVACHOL) 20 MG tablet take 1 tablet by mouth once daily 90 tablet 3  . Calcium-Vitamin D (CALTRATE 600 PLUS-VIT D PO) Take by mouth.     No current facility-administered medications on file prior to visit.    No Known Allergies Social History   Social History  . Marital status: Married    Spouse name: N/A  . Number of children: N/A  . Years of education: N/A   Occupational History  . Not on file.   Social History Main Topics  . Smoking status: Never Smoker  . Smokeless tobacco: Never Used  . Alcohol use 1.2 oz/week    2 Standard drinks or equivalent per week     Comment: Social  . Drug use: No  . Sexual activity: Yes    Partners: Male    Birth  control/ protection: Post-menopausal     Comment: Married to [employer], works at [employer]   Other Topics Concern  . Not on file   Social History Narrative  . No narrative on file   Family History  Problem Relation Age of Onset  . Cancer Mother     lung  . Intracerebral hemorrhage Father   . Heart disease Father   . Diabetes Father   . Kidney disease Father   . Cancer Maternal Aunt     colon  . Cancer Maternal Aunt     colon   . Pancreatic cancer Paternal Uncle   . Heart disease Maternal Grandfather   . Cancer Paternal Grandmother   . Pancreatic cancer Paternal Aunt       Review of Systems  All other systems reviewed and are negative.      Objective:   Physical Exam  Constitutional: She is oriented to person, place, and time. She appears well-developed and well-nourished. No distress.  HENT:  Head: Normocephalic and atraumatic.  Right Ear: External ear normal.  Left Ear: External ear normal.  Nose: Nose normal.  Mouth/Throat: Oropharynx is clear and moist. No oropharyngeal exudate.  Eyes: Conjunctivae and EOM are normal. Pupils are equal, round, and reactive to light. Right eye exhibits no discharge. Left eye exhibits  no discharge. No scleral icterus.  Neck: Normal range of motion. Neck supple. No JVD present. No tracheal deviation present. No thyromegaly present.  Cardiovascular: Normal rate, regular rhythm, normal heart sounds and intact distal pulses.  Exam reveals no gallop and no friction rub.   No murmur heard. Pulmonary/Chest: Effort normal and breath sounds normal. No stridor. No respiratory distress. She has no wheezes. She has no rales. She exhibits no tenderness.  Abdominal: Soft. Bowel sounds are normal. She exhibits no distension and no mass. There is no tenderness. There is no rebound and no guarding.  Musculoskeletal: Normal range of motion. She exhibits no edema or tenderness.  Lymphadenopathy:    She has no cervical adenopathy.  Neurological: She is alert  and oriented to person, place, and time. She has normal reflexes. No cranial nerve deficit. She exhibits normal muscle tone. Coordination normal.  Skin: Skin is warm. No rash noted. She is not diaphoretic. No erythema. No pallor.  Psychiatric: She has a normal mood and affect. Her behavior is normal. Judgment and thought content normal.  Vitals reviewed.      Assessment & Plan:  Routine general medical examination at a health care facility  Pure hypercholesterolemia  Screening breast examination - Plan: MM Digital Screening  Physical exam is outstanding. Patient had lab work obtained at [facility]. CBC was normal. CMP was normal. TSH was 2.1. Fasting lipid panel was significant for a total cholesterol 194, triglycerides of 55, HDL cholesterol of 87, and LDL cholesterol of 96. Cholesterol is significantly improved compared to her lab work from last year. 10 year cardiovascular risk is less than 2%. Continue pravastatin. History screening is up-to-date except for mammogram which I will schedule for her. Recommended thousand milligrams a day of calcium and 12,000 units daily vitamin D

## 2017-02-13 ENCOUNTER — Telehealth: Payer: Self-pay | Admitting: *Deleted

## 2017-02-13 ENCOUNTER — Encounter: Payer: Self-pay | Admitting: Obstetrics and Gynecology

## 2017-02-13 NOTE — Telephone Encounter (Signed)
Left message to call [name] at [phone].   From [name] To [name], MD Sent 02/13/2017 9:14 AM  Dr. Talbert Nan,   Back in February, I was in need of a refill for ESTRADIOL which I had been using [facility]. Well, I was told by pharmacy that they couldn't fill it and I would need approval from [facility] for them to fill it. So I switched back to [facility]. I'm guessing [facility] contacted your office for a new prescription. So I find out today that [facility] does not have ESTRADIOL in stock and it will not be available until MAY 10th. Well, I'm in need of it by Friday (02/16/17).    So now I have to go back to [facility] again. Could you please send a prescription for me to [facility] in [place] again? [facility] phone: [phone]   I do apologize for all this confusion on this prescription.  I tried to follow these insurance rules and it has been a royal pain.   Thank you so much,  Natasha Hawkins  July 15, 2061: bday

## 2017-02-13 NOTE — Telephone Encounter (Signed)
See telephone encounter dated 02/13/17.

## 2017-02-14 NOTE — Telephone Encounter (Signed)
Spoke with patient. Patient states [facility]  Released RX for estradiol to be filled at [facility] d/t medication not being available. Patient states no prescription needed at this time.  Patient thankful for return call. Advised to return call to office for any addition assistance. Patient verbalizes understanding.  Routing to provider for final review. Patient is agreeable to disposition. Will close encounter.

## 2017-02-21 ENCOUNTER — Other Ambulatory Visit: Payer: Self-pay | Admitting: Family Medicine

## 2017-02-21 ENCOUNTER — Encounter: Payer: Self-pay | Admitting: Family Medicine

## 2017-02-21 MED ORDER — LEVOXYL 50 MCG PO TABS: ORAL_TABLET | ORAL | 3 refills | Status: DC

## 2017-02-21 MED ORDER — PRAVASTATIN SODIUM 20 MG PO TABS: 20.0000 mg | ORAL_TABLET | Freq: Every day | ORAL | 3 refills | Status: DC

## 2017-02-26 ENCOUNTER — Ambulatory Visit
Admission: RE | Admit: 2017-02-26 | Discharge: 2017-02-26 | Disposition: A | Discharge status: Home / Self Care | Payer: Managed Care, Other (non HMO) | Source: Ambulatory Visit | Attending: Family Medicine | Admitting: Family Medicine

## 2017-02-26 DIAGNOSIS — Z1231 Encounter for screening mammogram for malignant neoplasm of breast: Secondary | ICD-10-CM | POA: Insufficient documentation

## 2017-02-26 DIAGNOSIS — Z1239 Encounter for other screening for malignant neoplasm of breast: Secondary | ICD-10-CM

## 2017-03-20 ENCOUNTER — Encounter: Payer: Self-pay | Admitting: Family Medicine

## 2017-03-20 MED ORDER — LEVOXYL 50 MCG PO TABS: ORAL_TABLET | ORAL | 3 refills | Status: DC

## 2017-05-01 ENCOUNTER — Encounter: Payer: Self-pay | Admitting: Obstetrics and Gynecology

## 2017-05-02 ENCOUNTER — Telehealth: Payer: Self-pay | Admitting: *Deleted

## 2017-05-02 NOTE — Telephone Encounter (Signed)
My Chart message from patient:  Hi,  I am having trouble getting my ESTRADIOL refilled. The online pharmacy I am suppose to use never has it in stock so I've been getting it at [facility]. Last month, [facility] could not get it either so they gave me Merril Abbe which they told me was a generic for Estradiol.       I just want to make sure Dr. Talbert Nan is ok with the use of Yuvafem. I preferred to stay on Estradiol but for some reason there seems to be a problem with the pharmacies keeping this in stock for whatever reason.    Thank you for your time!  Tia Alert

## 2017-05-02 NOTE — Telephone Encounter (Signed)
Call to patient. Left message to call back and ask for triage nurse.

## 2017-05-28 NOTE — Telephone Encounter (Signed)
Left message to call Kaitlyn at [phone]. 

## 2017-05-29 NOTE — Telephone Encounter (Signed)
Spoke with patient. Advised it is okay to use Yuvafem as this is a safe generic alternative to Estradiol vaginal tablets. Patient verbalizes understanding and will continue using Yuvafem as her pharmacy is able to get this medication easier than Estradiol.  Routing to provider for final review. Patient agreeable to disposition. Will close encounter.

## 2017-10-18 ENCOUNTER — Ambulatory Visit: Payer: Managed Care, Other (non HMO) | Admitting: Obstetrics and Gynecology

## 2017-10-18 ENCOUNTER — Encounter: Payer: Self-pay | Admitting: Obstetrics and Gynecology

## 2017-10-18 ENCOUNTER — Other Ambulatory Visit: Payer: Self-pay

## 2017-10-18 VITALS — BP 112/70 | HR 80 | Resp 16 | Ht 61.5 in | Wt 126.0 lb

## 2017-10-18 DIAGNOSIS — N941 Unspecified dyspareunia: Secondary | ICD-10-CM | POA: Diagnosis not present

## 2017-10-18 DIAGNOSIS — Z01419 Encounter for gynecological examination (general) (routine) without abnormal findings: Secondary | ICD-10-CM | POA: Diagnosis not present

## 2017-10-18 DIAGNOSIS — N952 Postmenopausal atrophic vaginitis: Secondary | ICD-10-CM | POA: Diagnosis not present

## 2017-10-18 MED ORDER — LIDOCAINE 5 % EX OINT: TOPICAL_OINTMENT | CUTANEOUS | 0 refills | Status: DC

## 2017-10-18 MED ORDER — ESTRADIOL 10 MCG VA TABS: ORAL_TABLET | VAGINAL | 3 refills | Status: DC

## 2017-10-18 NOTE — Patient Instructions (Signed)

## 2017-10-18 NOTE — Progress Notes (Signed)
56 y.o. G0P0 MarriedCaucasianF here for annual exam.  Last year she was started on vaginal estrogen for atrophy and inability to be sexually active. The estrogen helps a lot, she is also using lidocaine ointment prior to intercourse and that helps as well. No vaginal bleeding.     No LMP recorded. Patient is postmenopausal.          Sexually active: Yes.    The current method of family planning is post menopausal status.    Exercising: Yes.    weights/ cardio  Smoker:  no  Health Maintenance: Pap:  10-16-16 WNL NEG HR HPV 10-06-14 WNL  History of abnormal Pap:  Yes - 20 + yrs ago MMG:  02-26-17 WNL  Colonoscopy:  05-28-15 WNL repeat in 5 yrs BMD:   Never TDaP:  09-26-12 Gardasil: N/A   reports that  has never smoked. she has never used smokeless tobacco. She reports that she drinks about 0.6 oz of alcohol per week. She reports that she does not use drugs. 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   Past Medical History:  Diagnosis Date  . Biliary dyskinesia   . Endometriosis   . Heart murmur   . Hyperlipidemia   . Hypothyroidism   . Internal hemorrhoids     Past Surgical History:  Procedure Laterality Date  . APPENDECTOMY    . BREAST CYST ASPIRATION    . COLONOSCOPY WITH PROPOFOL N/A 05/28/2015   Procedure: COLONOSCOPY WITH PROPOFOL;  Surgeon: Manya Silvas, MD;  Location: [facility] ENDOSCOPY;  Service: Endoscopy;  Laterality: N/A;  . LAPAROSCOPIC SALPINGO OOPHERECTOMY Left   . OOPHORECTOMY      Current Outpatient Medications  Medication Sig Dispense Refill  . Calcium-Vitamin D (CALTRATE 600 PLUS-VIT D PO) Take by mouth.    . Estradiol 10 MCG TABS vaginal tablet 1 tablet vaginally 2 x a week at hs 24 tablet 3  . LEVOXYL 50 MCG tablet take 1 tablet by mouth once daily BEFORE BREAKFAST. 90 tablet 3  . lidocaine (XYLOCAINE) 5 % ointment Apply a pea sized amount topically 20 minutes prior to intercourse, then wipe it off just prior  to intercourse 30 g 0  . pravastatin (PRAVACHOL) 20 MG tablet Take 1 tablet (20 mg total) by mouth daily. 90 tablet 3   No current facility-administered medications for this visit.     Family History  Problem Relation Age of Onset  . Cancer Mother        lung  . Intracerebral hemorrhage Father   . Heart disease Father   . Diabetes Father   . Kidney disease Father   . Cancer Maternal Aunt        colon  . Cancer Maternal Aunt        colon   . Pancreatic cancer Paternal Uncle   . Heart disease Maternal Grandfather   . Cancer Paternal Grandmother   . Pancreatic cancer Paternal Aunt     Review of Systems  Constitutional: Negative.   HENT: Negative.   Eyes: Negative.   Respiratory: Negative.   Cardiovascular: Negative.   Gastrointestinal: Negative.   Endocrine: Negative.   Genitourinary: Negative.   Musculoskeletal: Negative.   Skin: Negative.   Allergic/Immunologic: Negative.   Neurological: Negative.   Psychiatric/Behavioral: Negative.     Exam:   BP 112/70 (BP Location: Right Arm, Patient Position: Sitting, Cuff Size: Normal)   Pulse 80   Resp 16   Ht  5' 1.5" (1.562 m)   Wt 126 lb (57.2 kg)   BMI 23.42 kg/m   Weight change: @WEIGHTCHANGE @ Height:   Height: 5' 1.5" (156.2 cm)  Ht Readings from Last 3 Encounters:  10/18/17 5' 1.5" (1.562 m)  01/30/17 5\' 2"  (1.575 m)  10/16/16 5' 1.5" (1.562 m)    General appearance: alert, cooperative and appears stated age Head: Normocephalic, without obvious abnormality, atraumatic Neck: no adenopathy, supple, symmetrical, trachea midline and thyroid normal to inspection and palpation Lungs: clear to auscultation bilaterally Cardiovascular: regular rate and rhythm Breasts: normal appearance, no masses or tenderness Abdomen: soft, non-tender; non distended,  no masses,  no organomegaly Extremities: extremities normal, atraumatic, no cyanosis or edema Skin: Skin color, texture, turgor normal. No rashes or lesions Lymph nodes:  Cervical, supraclavicular, and axillary nodes normal. No abnormal inguinal nodes palpated Neurologic: Grossly normal   Pelvic: External genitalia:  no lesions, tight introitus              Urethra:  normal appearing urethra with no masses, tenderness or lesions              Bartholins and Skenes: normal                 Vagina: normal appearing atrophic vagina with normal color and discharge, no lesions              Cervix: no lesions               Bimanual Exam:  Uterus:  normal size, contour, position, consistency, mobility, non-tender              Adnexa: no mass, fullness, tenderness               Rectovaginal: Confirms               Anus:  normal sphincter tone, no lesions  Chaperone was present for exam.  A:  Well Woman with normal exam  P:   No pap this year  Mammogram in the spring  Colonoscopy next year  Discussed breast self exam  Discussed calcium and vit D intake  Continue vagifem 2 x a week and lidocaine with intercourse  Labs with primary

## 2017-12-05 ENCOUNTER — Other Ambulatory Visit: Payer: Self-pay | Admitting: Family Medicine

## 2018-01-29 ENCOUNTER — Encounter: Payer: Self-pay | Admitting: Family Medicine

## 2018-02-04 ENCOUNTER — Encounter: Payer: Self-pay | Admitting: Family Medicine

## 2018-02-04 ENCOUNTER — Ambulatory Visit (INDEPENDENT_AMBULATORY_CARE_PROVIDER_SITE_OTHER): Payer: Managed Care, Other (non HMO) | Admitting: Family Medicine

## 2018-02-04 VITALS — BP 110/74 | HR 72 | Temp 98.4°F | Resp 14 | Ht 62.0 in | Wt 126.0 lb

## 2018-02-04 DIAGNOSIS — E7800 Pure hypercholesterolemia, unspecified: Secondary | ICD-10-CM

## 2018-02-04 DIAGNOSIS — Z1231 Encounter for screening mammogram for malignant neoplasm of breast: Secondary | ICD-10-CM

## 2018-02-04 DIAGNOSIS — Z0000 Encounter for general adult medical examination without abnormal findings: Secondary | ICD-10-CM

## 2018-02-04 DIAGNOSIS — E039 Hypothyroidism, unspecified: Secondary | ICD-10-CM

## 2018-02-04 DIAGNOSIS — Z1239 Encounter for other screening for malignant neoplasm of breast: Secondary | ICD-10-CM

## 2018-02-04 MED ORDER — LEVOXYL 50 MCG PO TABS: ORAL_TABLET | ORAL | 3 refills | Status: DC

## 2018-02-04 MED ORDER — PRAVASTATIN SODIUM 20 MG PO TABS: 20.0000 mg | ORAL_TABLET | Freq: Every day | ORAL | 3 refills | Status: DC

## 2018-02-04 NOTE — Progress Notes (Signed)
Subjective:    Patient ID: Natasha Hawkins, female    DOB: 1961-09-26, 57 y.o.   MRN: [id number]  HPI Patient is a very pleasant 58 year old white female. Patient had a colonoscopy in July 2016 which was normal.  Her mammogram is due. Her Pap smear is performed q 3 years now by her gynecologist.Her immunizations are up-to-date.  Since I last saw the patient, she is having frequent palpitations in her chest.  They tend to be associated with stress and anxiety.  She also has a history of mitral valve prolapse.  This raises the suspicion for PVCs.  Today on exam, I cannot appreciate any irregular heartbeats.  She denies any chest pain syncope or near syncope. Past Medical History:  Diagnosis Date  . Biliary dyskinesia   . Endometriosis   . Heart murmur   . Hyperlipidemia   . Hypothyroidism   . Internal hemorrhoids    Past Surgical History:  Procedure Laterality Date  . APPENDECTOMY    . BREAST CYST ASPIRATION    . COLONOSCOPY WITH PROPOFOL N/A 05/28/2015   Procedure: COLONOSCOPY WITH PROPOFOL;  Surgeon: Manya Silvas, MD;  Location: [facility] ENDOSCOPY;  Service: Endoscopy;  Laterality: N/A;  . LAPAROSCOPIC SALPINGO OOPHERECTOMY Left   . OOPHORECTOMY     Current Outpatient Medications on File Prior to Visit  Medication Sig Dispense Refill  . Calcium-Vitamin D (CALTRATE 600 PLUS-VIT D PO) Take by mouth.    . Estradiol 10 MCG TABS vaginal tablet 1 tablet vaginally 2 x a week at hs 24 tablet 3  . lidocaine (XYLOCAINE) 5 % ointment Apply a pea sized amount topically 20 minutes prior to intercourse, then wipe it off just prior to intercourse 30 g 0   No current facility-administered medications on file prior to visit.    No Known Allergies Social History   Socioeconomic History  . Marital status: Married    Spouse name: Not on file  . Number of children: Not on file  . Years of education: Not on file  . Highest education level: Not on file  Occupational History  . Not on file  Social  Needs  . Financial resource strain: Not on file  . Food insecurity:    Worry: Not on file    Inability: Not on file  . Transportation needs:    Medical: Not on file    Non-medical: Not on file  Tobacco Use  . Smoking status: Never Smoker  . Smokeless tobacco: Never Used  Substance and Sexual Activity  . Alcohol use: Yes    Alcohol/week: 0.6 oz    Types: 1 Standard drinks or equivalent per week    Comment: Social  . Drug use: No  . Sexual activity: Yes    Partners: Male    Birth control/protection: Post-menopausal    Comment: Married to [employer], works at [employer] [employer]  . Physical activity:    Days per week: Not on file    Minutes per session: Not on file  . Stress: Not on file  Relationships  . Social connections:    Talks on phone: Not on file    Gets together: Not on file    Attends religious service: Not on file    Active member of club or organization: Not on file    Attends meetings of clubs or organizations: Not on file    Relationship status: Not on file  . Intimate partner violence:    Fear of current or ex partner: Not  on file    Emotionally abused: Not on file    Physically abused: Not on file    Forced sexual activity: Not on file  Other Topics Concern  . Not on file  Social History Narrative  . Not on file   Family History  Problem Relation Age of Onset  . Cancer Mother        lung  . Intracerebral hemorrhage Father   . Heart disease Father   . Diabetes Father   . Kidney disease Father   . Osteoporosis Sister 63  . Cancer Maternal Aunt        colon  . Osteoporosis Maternal Aunt   . Cancer Maternal Aunt        colon   . Pancreatic cancer Paternal Uncle   . Heart disease Maternal Grandfather   . Cancer Paternal Grandmother   . Pancreatic cancer Paternal Aunt       Review of Systems  All other systems reviewed and are negative.      Objective:   Physical Exam  Constitutional: She is oriented to person, place, and time. She appears  well-developed and well-nourished. No distress.  HENT:  Head: Normocephalic and atraumatic.  Right Ear: External ear normal.  Left Ear: External ear normal.  Nose: Nose normal.  Mouth/Throat: Oropharynx is clear and moist. No oropharyngeal exudate.  Eyes: Pupils are equal, round, and reactive to light. Conjunctivae and EOM are normal. Right eye exhibits no discharge. Left eye exhibits no discharge. No scleral icterus.  Neck: Normal range of motion. Neck supple. No JVD present. No tracheal deviation present. No thyromegaly present.  Cardiovascular: Normal rate, regular rhythm, normal heart sounds and intact distal pulses. Exam reveals no gallop and no friction rub.  No murmur heard. Pulmonary/Chest: Effort normal and breath sounds normal. No stridor. No respiratory distress. She has no wheezes. She has no rales. She exhibits no tenderness.  Abdominal: Soft. Bowel sounds are normal. She exhibits no distension and no mass. There is no tenderness. There is no rebound and no guarding.  Musculoskeletal: Normal range of motion. She exhibits no edema or tenderness.  Lymphadenopathy:    She has no cervical adenopathy.  Neurological: She is alert and oriented to person, place, and time. She has normal reflexes. No cranial nerve deficit. She exhibits normal muscle tone. Coordination normal.  Skin: Skin is warm. No rash noted. She is not diaphoretic. No erythema. No pallor.  Psychiatric: She has a normal mood and affect. Her behavior is normal. Judgment and thought content normal.  Vitals reviewed.      Assessment & Plan:  Routine general medical examination at a health care facility  Pure hypercholesterolemia  Hypothyroidism, unspecified type  Screening breast examination - Plan: MM Digital Screening  Physical exam is outstanding.  Patient had lab work obtained at [facility]. CBC was normal. CMP was normal. TSH was 3.24. Fasting lipid panel was significant for a total cholesterol 194,  triglycerides of 70, HDL cholesterol of 77, and LDL cholesterol of 103. Continue pravastatin.  I will schedule for her a mammogram in [place]. Schedule Holter monitor for palpitations but I suspect PVC's.

## 2018-02-05 ENCOUNTER — Telehealth: Payer: Self-pay | Admitting: Family Medicine

## 2018-02-05 NOTE — Telephone Encounter (Signed)
-----   Message from Susy Frizzle, MD sent at 02/04/2018  8:58 AM EDT ----- Patient needs holter monitor for palpitations.

## 2018-02-05 NOTE — Telephone Encounter (Signed)
[facility] call and ordered holter monitor

## 2018-02-08 NOTE — Telephone Encounter (Signed)
Received holter monitor - pt called and made aware to schedule apt to have put on via vm

## 2018-02-15 ENCOUNTER — Other Ambulatory Visit: Payer: Self-pay | Admitting: Family Medicine

## 2018-02-19 ENCOUNTER — Ambulatory Visit: Payer: Managed Care, Other (non HMO)

## 2018-02-19 DIAGNOSIS — R002 Palpitations: Secondary | ICD-10-CM

## 2018-02-19 NOTE — Progress Notes (Signed)
Patient came in to have Holter Monitor placed. Informed patient to not shower for 24 hours and to do normal activity. Patient verbalized understanding. Holter Monitor was place.

## 2018-02-20 ENCOUNTER — Ambulatory Visit: Payer: Managed Care, Other (non HMO)

## 2018-02-20 DIAGNOSIS — R002 Palpitations: Secondary | ICD-10-CM

## 2018-02-20 NOTE — Progress Notes (Signed)
Patient was in office for holter monitor removal

## 2018-03-04 ENCOUNTER — Encounter: Payer: Self-pay | Admitting: Family Medicine

## 2018-04-26 ENCOUNTER — Ambulatory Visit
Admission: RE | Admit: 2018-04-26 | Discharge: 2018-04-26 | Disposition: A | Discharge status: Home / Self Care | Payer: Managed Care, Other (non HMO) | Source: Ambulatory Visit | Attending: Family Medicine | Admitting: Family Medicine

## 2018-04-26 DIAGNOSIS — Z1231 Encounter for screening mammogram for malignant neoplasm of breast: Secondary | ICD-10-CM | POA: Insufficient documentation

## 2018-04-26 DIAGNOSIS — Z1239 Encounter for other screening for malignant neoplasm of breast: Secondary | ICD-10-CM

## 2018-08-27 ENCOUNTER — Other Ambulatory Visit: Payer: Self-pay | Admitting: Obstetrics and Gynecology

## 2018-08-28 NOTE — Telephone Encounter (Signed)
Medication refill request: Estradiol  Last AEX:  10/18/17 Next AEX: 11/21/18 Last MMG (if hormonal medication request): Bi-rads 1 Neg  Refill authorized: #24 with 3 RF Please refill if appropriate.

## 2018-11-14 ENCOUNTER — Other Ambulatory Visit: Payer: Self-pay | Admitting: Obstetrics and Gynecology

## 2018-11-19 ENCOUNTER — Telehealth: Payer: Self-pay

## 2018-11-19 NOTE — Telephone Encounter (Signed)
Left message to call [place] at [phone].  Patient has an appointment with Dr.Jertson on 11/21/2018. This needs to be rescheduled as Dr.Jertson is out of the office.

## 2018-11-21 ENCOUNTER — Ambulatory Visit: Payer: Managed Care, Other (non HMO) | Admitting: Obstetrics and Gynecology

## 2018-11-30 ENCOUNTER — Other Ambulatory Visit: Payer: Self-pay | Admitting: Obstetrics and Gynecology

## 2018-12-02 NOTE — Telephone Encounter (Signed)
Medication refill request: Estradiol Last AEX:  10/18/17 Next AEX: 12/11/18 Last MMG (if hormonal medication request): 04/26/18 BIRADS 1 negative/density c Refill authorized: 08/28/18 #24 w/0 refills; today please advise; Order pended for #8 w/0 refills if authorized

## 2018-12-11 ENCOUNTER — Encounter: Payer: Self-pay | Admitting: Obstetrics and Gynecology

## 2018-12-11 ENCOUNTER — Ambulatory Visit: Payer: Managed Care, Other (non HMO) | Admitting: Obstetrics and Gynecology

## 2018-12-11 ENCOUNTER — Other Ambulatory Visit: Payer: Self-pay

## 2018-12-11 VITALS — BP 138/72 | HR 72 | Ht 61.5 in | Wt 122.2 lb

## 2018-12-11 DIAGNOSIS — N393 Stress incontinence (female) (male): Secondary | ICD-10-CM | POA: Diagnosis not present

## 2018-12-11 DIAGNOSIS — Z01419 Encounter for gynecological examination (general) (routine) without abnormal findings: Secondary | ICD-10-CM | POA: Diagnosis not present

## 2018-12-11 DIAGNOSIS — N952 Postmenopausal atrophic vaginitis: Secondary | ICD-10-CM

## 2018-12-11 MED ORDER — ESTRADIOL 10 MCG VA TABS: ORAL_TABLET | VAGINAL | 3 refills | Status: DC

## 2018-12-11 MED ORDER — LIDOCAINE 5 % EX OINT: TOPICAL_OINTMENT | CUTANEOUS | 1 refills | Status: DC

## 2018-12-11 NOTE — Progress Notes (Signed)
58 y.o. G0P0 Married White or Caucasian Not Hispanic or Latino female here for annual exam.   She uses vaginal estrogen for atrophy and a h/o dyspareunia. She also uses lidocaine with intercourse. Able to have intercourse and enjoy it.    No vaginal bleeding.  She has some mild GSI with URI.   No LMP recorded. Patient is postmenopausal.          Sexually active: Yes.    The current method of family planning is post menopausal status.    Exercising: Yes.    weight lift Smoker:  no  Health Maintenance: Pap:  10-16-16 WNL NEG HR HPV 10-06-14 WNL  History of abnormal Pap:  Yes - 20 + yrs ago, no surgery on her cervix MMG: 04/26/2018 Birads 1 negative Colonoscopy:  05-28-15 WNL repeat in 5 yrs BMD:   Never TDaP:  09-26-12 Gardasil: N/A   reports that she has never smoked. She has never used smokeless tobacco. She reports current alcohol use of about 1.0 standard drinks of alcohol per week. She reports that she does not use drugs. 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 Biliary dyskinesia   . Endometriosis   . Heart murmur   . Hyperlipidemia   . Hypothyroidism   . Internal hemorrhoids     Past Surgical History:  Procedure Laterality Date  . APPENDECTOMY    . BREAST CYST ASPIRATION Left   . COLONOSCOPY WITH PROPOFOL N/A 05/28/2015   Procedure: COLONOSCOPY WITH PROPOFOL;  Surgeon: Manya Silvas, MD;  Location: [facility] ENDOSCOPY;  Service: Endoscopy;  Laterality: N/A;  . LAPAROSCOPIC SALPINGO OOPHERECTOMY Left   . OOPHORECTOMY      Current Outpatient Medications  Medication Sig Dispense Refill  . Calcium-Vitamin D (CALTRATE 600 PLUS-VIT D PO) Take by mouth.    . Estradiol 10 MCG TABS vaginal tablet INSERT 1 TABLET VAGINALLY  TWICE WEEKLY AT BEDTIME 24 tablet 0  . LEVOXYL 50 MCG tablet TAKE 1 TABLET BY MOUTH ONCE DAILY BEFORE BREAKFAST. 90 tablet 3  . lidocaine (XYLOCAINE) 5 % ointment Apply a  pea sized amount topically 20 minutes prior to intercourse, then wipe it off just prior to intercourse 30 g 0  . pravastatin (PRAVACHOL) 20 MG tablet Take 1 tablet (20 mg total) by mouth daily. 90 tablet 3   No current facility-administered medications for this visit.     Family History  Problem Relation Age of Onset  . Cancer Mother        lung  . Intracerebral hemorrhage Father   . Heart disease Father   . Diabetes Father   . Kidney disease Father   . Osteoporosis Sister 33  . Cancer Maternal Aunt        colon  . Osteoporosis Maternal Aunt   . Cancer Maternal Aunt        colon   . Pancreatic cancer Paternal Uncle   . Heart disease Maternal Grandfather   . Cancer Paternal Grandmother   . Pancreatic cancer Paternal Aunt   . Ovarian cancer Paternal Aunt 31  3 of her Moms 9 siblings with colon cancer On her Dads side Aunt with ovarian cancer in her 57's  Review of Systems  Constitutional: Negative.   HENT: Positive for congestion, rhinorrhea, sinus pressure and sinus pain.   Eyes: Negative.   Respiratory: Negative.   Cardiovascular: Negative.   Gastrointestinal: Negative.  Endocrine: Negative.   Genitourinary: Negative.   Musculoskeletal: Negative.   Skin: Negative.   Allergic/Immunologic: Negative.   Neurological: Negative.   Hematological: Negative.   Psychiatric/Behavioral: Negative.     Exam:   BP 138/72 (BP Location: Right Arm, Patient Position: Sitting, Cuff Size: Normal)   Pulse 72   Ht 5' 1.5" (1.562 m)   Wt 122 lb 3.2 oz (55.4 kg)   BMI 22.72 kg/m   Weight change: @WEIGHTCHANGE @ Height:   Height: 5' 1.5" (156.2 cm)  Ht Readings from Last 3 Encounters:  12/11/18 5' 1.5" (1.562 m)  02/04/18 5\' 2"  (1.575 m)  10/18/17 5' 1.5" (1.562 m)    General appearance: alert, cooperative and appears stated age Head: Normocephalic, without obvious abnormality, atraumatic Neck: no adenopathy, supple, symmetrical, trachea midline and thyroid normal to inspection and  palpation Lungs: clear to auscultation bilaterally Cardiovascular: regular rate and rhythm Breasts: normal appearance, no masses or tenderness Abdomen: soft, non-tender; non distended,  no masses,  no organomegaly Extremities: extremities normal, atraumatic, no cyanosis or edema Skin: Skin color, texture, turgor normal. No rashes or lesions Lymph nodes: Cervical, supraclavicular, and axillary nodes normal. No abnormal inguinal nodes palpated Neurologic: Grossly normal   Pelvic: External genitalia:  no lesions              Urethra:  normal appearing urethra with no masses, tenderness or lesions              Bartholins and Skenes: normal                 Vagina: normal appearing vagina with normal color and discharge, no lesions              Cervix: no lesions               Bimanual Exam:  Uterus:  normal size, contour, position, consistency, mobility, non-tender              Adnexa: no mass, fullness, tenderness               Rectovaginal: Confirms               Anus:  normal sphincter tone, no lesions  Chaperone was present for exam.  A:  Well Woman with normal exam  FH of colon cancer  GSI only with URI  P:   Pap next year  Vagifem and lidocaine scripts sent  Colonoscopies every 5 years  Mammogram in the summer  Labs with primary  Discussed breast self exam  Discussed calcium and vit D intake

## 2018-12-11 NOTE — Patient Instructions (Signed)
Kegel Exercises Kegel exercises help strengthen the muscles that support the rectum, vagina, small intestine, bladder, and uterus. Doing Kegel exercises can help:  Improve bladder and bowel control.  Improve sexual response.  Reduce problems and discomfort during pregnancy. Kegel exercises involve squeezing your pelvic floor muscles, which are the same muscles you squeeze when you try to stop the flow of urine. The exercises can be done while sitting, standing, or lying down, but it is best to vary your position. Exercises 1. Squeeze your pelvic floor muscles tight. You should feel a tight lift in your rectal area. If you are a female, you should also feel a tightness in your vaginal area. Keep your stomach, buttocks, and legs relaxed. 2. Hold the muscles tight for up to 10 seconds. 3. Relax your muscles. Repeat this exercise 50 times a day or as many times as told by your health care provider. Continue to do this exercise for at least 4-6 weeks or for as long as told by your health care provider. This information is not intended to replace advice given to you by your health care provider. Make sure you discuss any questions you have with your health care provider. Document Released: 10/09/2012 Document Revised: 03/05/2017 Document Reviewed: 09/12/2015 Elsevier Interactive Patient Education  2019 [place] AND DIET:  We recommended that you start or continue a regular exercise program for good health. Regular exercise means any activity that makes your heart beat faster and makes you sweat.  We recommend exercising at least 30 minutes per day at least 3 days a week, preferably 4 or 5.  We also recommend a diet low in fat and sugar.  Inactivity, poor dietary choices and obesity can cause diabetes, heart attack, stroke, and kidney damage, among others.    ALCOHOL AND SMOKING:  Women should limit their alcohol intake to no more than 7 drinks/beers/glasses of wine (combined, not  each!) per week. Moderation of alcohol intake to this level decreases your risk of breast cancer and liver damage. And of course, no recreational drugs are part of a healthy lifestyle.  And absolutely no smoking or even second hand smoke. Most people know smoking can cause heart and lung diseases, but did you know it also contributes to weakening of your bones? Aging of your skin?  Yellowing of your teeth and nails?  CALCIUM AND VITAMIN D:  Adequate intake of calcium and Vitamin D are recommended.  The recommendations for exact amounts of these supplements seem to change often, but generally speaking 1,200 mg of calcium (between diet and supplement) and 800 units of Vitamin D per day seems prudent. Certain women may benefit from higher intake of Vitamin D.  If you are among these women, your doctor will have told you during your visit.    PAP SMEARS:  Pap smears, to check for cervical cancer or precancers,  have traditionally been done yearly, although recent scientific advances have shown that most women can have pap smears less often.  However, every woman still should have a physical exam from her gynecologist every year. It will include a breast check, inspection of the vulva and vagina to check for abnormal growths or skin changes, a visual exam of the cervix, and then an exam to evaluate the size and shape of the uterus and ovaries.  And after 59 years of age, a rectal exam is indicated to check for rectal cancers. We will also provide age appropriate advice regarding health maintenance, like when  you should have certain vaccines, screening for sexually transmitted diseases, bone density testing, colonoscopy, mammograms, etc.   MAMMOGRAMS:  All women over 11 years old should have a yearly mammogram. Many facilities now offer a "3D" mammogram, which may cost around $50 extra out of pocket. If possible,  we recommend you accept the option to have the 3D mammogram performed.  It both reduces the number of  women who will be called back for extra views which then turn out to be normal, and it is better than the routine mammogram at detecting truly abnormal areas.    COLON CANCER SCREENING: Now recommend starting at age 21. At this time colonoscopy is not covered for routine screening until 50. There are take home tests that can be done between 45-49.   COLONOSCOPY:  Colonoscopy to screen for colon cancer is recommended for all women at age 75.  We know, you hate the idea of the prep.  We agree, BUT, having colon cancer and not knowing it is worse!!  Colon cancer so often starts as a polyp that can be seen and removed at colonscopy, which can quite literally save your life!  And if your first colonoscopy is normal and you have no family history of colon cancer, most women don't have to have it again for 10 years.  Once every ten years, you can do something that may end up saving your life, right?  We will be happy to help you get it scheduled when you are ready.  Be sure to check your insurance coverage so you understand how much it will cost.  It may be covered as a preventative service at no cost, but you should check your particular policy.      Breast Self-Awareness Breast self-awareness means being familiar with how your breasts look and feel. It involves checking your breasts regularly and reporting any changes to your health care provider. Practicing breast self-awareness is important. A change in your breasts can be a sign of a serious medical problem. Being familiar with how your breasts look and feel allows you to find any problems early, when treatment is more likely to be successful. All women should practice breast self-awareness, including women who have had breast implants. How to do a breast self-exam One way to learn what is normal for your breasts and whether your breasts are changing is to do a breast self-exam. To do a breast self-exam: Look for Changes  1. Remove all the clothing  above your waist. 2. Stand in front of a mirror in a room with good lighting. 3. Put your hands on your hips. 4. Push your hands firmly downward. 5. Compare your breasts in the mirror. Look for differences between them (asymmetry), such as: ? Differences in shape. ? Differences in size. ? Puckers, dips, and bumps in one breast and not the other. 6. Look at each breast for changes in your skin, such as: ? Redness. ? Scaly areas. 7. Look for changes in your nipples, such as: ? Discharge. ? Bleeding. ? Dimpling. ? Redness. ? A change in position. Feel for Changes Carefully feel your breasts for lumps and changes. It is best to do this while lying on your back on the floor and again while sitting or standing in the shower or tub with soapy water on your skin. Feel each breast in the following way:  Place the arm on the side of the breast you are examining above your head.  Feel your breast with  the other hand.  Start in the nipple area and make  inch (2 cm) overlapping circles to feel your breast. Use the pads of your three middle fingers to do this. Apply light pressure, then medium pressure, then firm pressure. The light pressure will allow you to feel the tissue closest to the skin. The medium pressure will allow you to feel the tissue that is a little deeper. The firm pressure will allow you to feel the tissue close to the ribs.  Continue the overlapping circles, moving downward over the breast until you feel your ribs below your breast.  Move one finger-width toward the center of the body. Continue to use the  inch (2 cm) overlapping circles to feel your breast as you move slowly up toward your collarbone.  Continue the up and down exam using all three pressures until you reach your armpit.  Write Down What You Find  Write down what is normal for each breast and any changes that you find. Keep a written record with breast changes or normal findings for each breast. By writing  this information down, you do not need to depend only on memory for size, tenderness, or location. Write down where you are in your menstrual cycle, if you are still menstruating. If you are having trouble noticing differences in your breasts, do not get discouraged. With time you will become more familiar with the variations in your breasts and more comfortable with the exam. How often should I examine my breasts? Examine your breasts every month. If you are breastfeeding, the best time to examine your breasts is after a feeding or after using a breast pump. If you menstruate, the best time to examine your breasts is 5-7 days after your period is over. During your period, your breasts are lumpier, and it may be more difficult to notice changes. When should I see my health care provider? See your health care provider if you notice:  A change in shape or size of your breasts or nipples.  A change in the skin of your breast or nipples, such as a reddened or scaly area.  Unusual discharge from your nipples.  A lump or thick area that was not there before.  Pain in your breasts.  Anything that concerns you.

## 2018-12-11 NOTE — Telephone Encounter (Signed)
Patient was seen on 10/11/2019 at 11:20 am with Dr.Jertson. Encounter closed.

## 2019-01-20 ENCOUNTER — Other Ambulatory Visit: Payer: Self-pay | Admitting: Obstetrics and Gynecology

## 2019-02-05 ENCOUNTER — Other Ambulatory Visit: Payer: Self-pay | Admitting: Obstetrics and Gynecology

## 2019-03-03 ENCOUNTER — Encounter: Payer: Self-pay | Admitting: Family Medicine

## 2019-03-11 ENCOUNTER — Encounter: Payer: Self-pay | Admitting: Obstetrics and Gynecology

## 2019-03-12 ENCOUNTER — Telehealth: Payer: Self-pay | Admitting: Obstetrics and Gynecology

## 2019-03-12 NOTE — Telephone Encounter (Signed)
Spoke with patient, advised as seen below per [facility]. Patient verbalizes understanding and is agreeable.   Encounter closed.

## 2019-03-12 NOTE — Telephone Encounter (Signed)
Patient sent the following message through [facility]. Routing to triage to assist patient with request.  Hi Dr. Talbert Nan,   Just have a question about my Estradiol prescription. I got a notification from [facility] that request was denied for refill. But when I look at [facility], it says that it was prescribed on the day that I had my appt with you on Feb. 5th. Not sure what's going on with [facility] but could you resend the prescription to them for the Estradiol? Please let me know if you need further info from me!    Thank you so much!  Natasha Hawkins)  bday: [id number]

## 2019-03-12 NOTE — Telephone Encounter (Signed)
Rx for Estradiol 104mcg vaginal  tab #24/3RF, sent on 12/11/18.   Call placed to [facility], spoke with Lennette Bihari, pharmacy tech. Was advised Rx on file, will be shipped out today, should received in 3-5 business days.

## 2019-04-29 ENCOUNTER — Other Ambulatory Visit: Payer: Self-pay

## 2019-04-29 ENCOUNTER — Encounter: Payer: Self-pay | Admitting: Family Medicine

## 2019-04-29 ENCOUNTER — Ambulatory Visit (INDEPENDENT_AMBULATORY_CARE_PROVIDER_SITE_OTHER): Payer: Managed Care, Other (non HMO) | Admitting: Family Medicine

## 2019-04-29 VITALS — BP 118/80 | HR 74 | Temp 98.6°F | Ht 62.0 in | Wt 123.0 lb

## 2019-04-29 DIAGNOSIS — Z8262 Family history of osteoporosis: Secondary | ICD-10-CM

## 2019-04-29 DIAGNOSIS — Z0001 Encounter for general adult medical examination with abnormal findings: Secondary | ICD-10-CM | POA: Diagnosis not present

## 2019-04-29 DIAGNOSIS — H9191 Unspecified hearing loss, right ear: Secondary | ICD-10-CM | POA: Diagnosis not present

## 2019-04-29 DIAGNOSIS — E038 Other specified hypothyroidism: Secondary | ICD-10-CM

## 2019-04-29 DIAGNOSIS — Z0000 Encounter for general adult medical examination without abnormal findings: Secondary | ICD-10-CM

## 2019-04-29 DIAGNOSIS — E7800 Pure hypercholesterolemia, unspecified: Secondary | ICD-10-CM

## 2019-04-29 DIAGNOSIS — Z780 Asymptomatic menopausal state: Secondary | ICD-10-CM | POA: Diagnosis not present

## 2019-04-29 MED ORDER — LEVOXYL 50 MCG PO TABS: ORAL_TABLET | ORAL | 3 refills | Status: DC

## 2019-04-29 MED ORDER — PRAVASTATIN SODIUM 20 MG PO TABS: 20.0000 mg | ORAL_TABLET | Freq: Every day | ORAL | 3 refills | Status: DC

## 2019-04-29 NOTE — Progress Notes (Signed)
Subjective:    Patient ID: Natasha Hawkins, female    DOB: 1960/12/20, 58 y.o.   MRN: [id number]  HPI Patient is a 58 year old Caucasian female here today for complete physical exam.  Her last mammogram was June of last year.  She is due this year.  Her last Pap smear was in 2017.  She already has a scheduled with her gynecologist for January of this upcoming year.  She is yet to schedule her mammogram.  She does have a family history of osteopenia and osteoporosis in close female relatives including cousins and aunts.  She also has a history of hypothyroidism.  She denies any bone pain or pathologic fractures.  Immunizations are up-to-date except for her shingles vaccine.  She is yet to go for fasting lab work but she is scheduled to go for that today.  Does complain of some mild hearing loss in her right ear.  Audiometry today in office does reveal some mild high-frequency hearing loss.  Patient is only able to discern 40 dB in her right ear at high frequencies such as 2000 and 4000 Hz Past Medical History:  Diagnosis Date  . Biliary dyskinesia   . Endometriosis   . Heart murmur   . Hyperlipidemia   . Hypothyroidism   . Internal hemorrhoids    Past Surgical History:  Procedure Laterality Date  . APPENDECTOMY    . BREAST CYST ASPIRATION Left   . COLONOSCOPY WITH PROPOFOL N/A 05/28/2015   Procedure: COLONOSCOPY WITH PROPOFOL;  Surgeon: Manya Silvas, MD;  Location: [facility] ENDOSCOPY;  Service: Endoscopy;  Laterality: N/A;  . LAPAROSCOPIC SALPINGO OOPHERECTOMY Left   . OOPHORECTOMY     Current Outpatient Medications on File Prior to Visit  Medication Sig Dispense Refill  . Calcium-Vitamin D (CALTRATE 600 PLUS-VIT D PO) Take by mouth.    . Estradiol 10 MCG TABS vaginal tablet Place one tablet vaginally 2 x a week at hs 24 tablet 3  . LEVOXYL 50 MCG tablet TAKE 1 TABLET BY MOUTH ONCE DAILY BEFORE BREAKFAST. 90 tablet 3  . lidocaine (XYLOCAINE) 5 % ointment Apply a pea sized amount topically  20 minutes prior to intercourse, then wipe it off just prior to intercourse 30 g 1  . pravastatin (PRAVACHOL) 20 MG tablet Take 1 tablet (20 mg total) by mouth daily. 90 tablet 3   No current facility-administered medications on file prior to visit.    No Known Allergies Social History   Socioeconomic History  . Marital status: Married    Spouse name: Not on file  . Number of children: Not on file  . Years of education: Not on file  . Highest education level: Not on file  Occupational History  . Not on file  Social Needs  . Financial resource strain: Not on file  . Food insecurity    Worry: Not on file    Inability: Not on file  . Transportation needs    Medical: Not on file    Non-medical: Not on file  Tobacco Use  . Smoking status: Never Smoker  . Smokeless tobacco: Never Used  Substance and Sexual Activity  . Alcohol use: Yes    Alcohol/week: 1.0 standard drinks    Types: 1 Standard drinks or equivalent per week    Comment: Social  . Drug use: No  . Sexual activity: Yes    Partners: Male    Birth control/protection: Post-menopausal    Comment: Married to [employer], works at [employer]  .  Physical activity    Days per week: Not on file    Minutes per session: Not on file  . Stress: Not on file  Relationships  . Social Herbalist on phone: Not on file    Gets together: Not on file    Attends religious service: Not on file    Active member of club or organization: Not on file    Attends meetings of clubs or organizations: Not on file    Relationship status: Not on file  . Intimate partner violence    Fear of current or ex partner: Not on file    Emotionally abused: Not on file    Physically abused: Not on file    Forced sexual activity: Not on file  Other Topics Concern  . Not on file  Social History Narrative  . Not on file   Family History  Problem Relation Age of Onset  . Cancer Mother        lung  . Intracerebral hemorrhage Father   .  Heart disease Father   . Diabetes Father   . Kidney disease Father   . Osteoporosis Sister 26  . Cancer Maternal Aunt        colon  . Osteoporosis Maternal Aunt   . Cancer Maternal Aunt        colon   . Pancreatic cancer Paternal Uncle   . Heart disease Maternal Grandfather   . Cancer Paternal Grandmother   . Pancreatic cancer Paternal Aunt   . Ovarian cancer Paternal Aunt 51      Review of Systems  All other systems reviewed and are negative.      Objective:   Physical Exam  Constitutional: She is oriented to person, place, and time. She appears well-developed and well-nourished. No distress.  HENT:  Head: Normocephalic and atraumatic.  Right Ear: External ear normal.  Left Ear: External ear normal.  Nose: Nose normal.  Mouth/Throat: Oropharynx is clear and moist. No oropharyngeal exudate.  Eyes: Pupils are equal, round, and reactive to light. Conjunctivae and EOM are normal. Right eye exhibits no discharge. Left eye exhibits no discharge. No scleral icterus.  Neck: Normal range of motion. Neck supple. No JVD present. No tracheal deviation present. No thyromegaly present.  Cardiovascular: Normal rate, regular rhythm, normal heart sounds and intact distal pulses. Exam reveals no gallop and no friction rub.  No murmur heard. Pulmonary/Chest: Effort normal and breath sounds normal. No stridor. No respiratory distress. She has no wheezes. She has no rales. She exhibits no tenderness.  Abdominal: Soft. Bowel sounds are normal. She exhibits no distension and no mass. There is no abdominal tenderness. There is no rebound and no guarding.  Musculoskeletal: Normal range of motion.        General: No tenderness or edema.  Lymphadenopathy:    She has no cervical adenopathy.  Neurological: She is alert and oriented to person, place, and time. She has normal reflexes. No cranial nerve deficit. She exhibits normal muscle tone. Coordination normal.  Skin: Skin is warm. No rash noted. She  is not diaphoretic. No erythema. No pallor.  Psychiatric: She has a normal mood and affect. Her behavior is normal. Judgment and thought content normal.  Vitals reviewed.      Assessment & Plan:  1. Postmenopausal estrogen deficiency Given her family history of premature osteoporosis, her hypothyroidism, and her postmenopausal estrogen deficiency, I have recommended a bone density test to screen for osteoporosis.  Also recommended 1200  mg a day of calcium and 1000 units a day of vitamin D- DG BONE DENSITY (DXA); Future  2. Other specified hypothyroidism Check TSH to ensure adequate replacement with levothyroxine - DG BONE DENSITY (DXA); Future  3. Fam hx-osteoporosis - DG BONE DENSITY (DXA); Future  4. Routine general medical examination at a health care facility Recommended the patient schedule her mammogram in addition to a bone density test.  Her Pap smear has been scheduled for January.  Her colonoscopy is due in next year.  Her last colonoscopy was in 2016 and it was completely clear.  They recommended a repeat colonoscopy in 5 years due to family history of colon cancer.  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  However the present time is not significant enough that she would wear hearing aids.  Recommended that if the hearing loss progresses we get her evaluated by audiology.  Also recommended hearing protection to prevent worsening  5. Pure hypercholesterolemia Check fasting lipid panel at her earliest convenience

## 2019-05-02 ENCOUNTER — Encounter: Payer: Self-pay | Admitting: Family Medicine

## 2019-05-07 ENCOUNTER — Other Ambulatory Visit: Payer: Self-pay | Admitting: Family Medicine

## 2019-05-07 DIAGNOSIS — Z1231 Encounter for screening mammogram for malignant neoplasm of breast: Secondary | ICD-10-CM

## 2019-06-16 ENCOUNTER — Ambulatory Visit
Admission: RE | Admit: 2019-06-16 | Discharge: 2019-06-16 | Disposition: A | Discharge status: Home / Self Care | Payer: Managed Care, Other (non HMO) | Source: Ambulatory Visit | Attending: Family Medicine | Admitting: Family Medicine

## 2019-06-16 DIAGNOSIS — Z780 Asymptomatic menopausal state: Secondary | ICD-10-CM

## 2019-06-16 DIAGNOSIS — E038 Other specified hypothyroidism: Secondary | ICD-10-CM | POA: Insufficient documentation

## 2019-06-16 DIAGNOSIS — Z8262 Family history of osteoporosis: Secondary | ICD-10-CM | POA: Diagnosis present

## 2019-06-16 DIAGNOSIS — Z1231 Encounter for screening mammogram for malignant neoplasm of breast: Secondary | ICD-10-CM | POA: Diagnosis present

## 2019-06-17 ENCOUNTER — Encounter: Payer: Self-pay | Admitting: Family Medicine

## 2019-06-17 DIAGNOSIS — M8580 Other specified disorders of bone density and structure, unspecified site: Secondary | ICD-10-CM | POA: Insufficient documentation

## 2019-10-14 ENCOUNTER — Other Ambulatory Visit: Payer: Self-pay

## 2019-10-14 ENCOUNTER — Ambulatory Visit
Admission: EM | Admit: 2019-10-14 | Discharge: 2019-10-14 | Disposition: A | Discharge status: Home / Self Care | Payer: Managed Care, Other (non HMO) | Attending: Emergency Medicine | Admitting: Emergency Medicine

## 2019-10-14 DIAGNOSIS — S61217A Laceration without foreign body of left little finger without damage to nail, initial encounter: Secondary | ICD-10-CM

## 2019-10-14 MED ORDER — CEPHALEXIN 500 MG PO CAPS: 500.0000 mg | ORAL_CAPSULE | Freq: Two times a day (BID) | ORAL | 0 refills | Status: AC

## 2019-10-14 MED ORDER — CEPHALEXIN 500 MG PO CAPS: 500.0000 mg | ORAL_CAPSULE | Freq: Two times a day (BID) | ORAL | 0 refills | Status: DC

## 2019-10-14 NOTE — Discharge Instructions (Signed)
Bandage applied Keep covered for next and dry for next 24-48 hours.  After then you may gently clean with warm water and mild soap.  Avoid submerging wound in water. Change dressing daily and apply a thin layer of neosporin.  Keflex prescribed for infection prophylaxis given location of wound Return in 10 days to have sutures removed.   Take OTC ibuprofen or tylenol as needed for pain releif Return sooner or go to the ED if you have any new or worsening symptoms such as increased pain, redness, swelling, drainage, discharge, decreased range of motion of extremity, etc..

## 2019-10-14 NOTE — ED Triage Notes (Signed)
Pt presents with laceration to 1st finger on left hand. Pt cut with pruning sheers last night

## 2019-10-14 NOTE — ED Provider Notes (Signed)
[place]   [id number] 10/14/19 Arrival Time: [id number]  CC: LACERATION  SUBJECTIVE:  EDDY Hawkins is a 58 y.o. female who presents with a laceration that occurred 12 hours ago.  Symptoms began after pruning her flower bushes last night and cut left small finger.  Bleeding controlled.  Currently not on blood thinners.  Denies similar symptoms in the past.  Denies fever, chills, nausea, vomiting, redness, swelling, purulent drainage, decrease strength or sensation.   Td UTD: Yes.  ROS: As per HPI.  All other pertinent ROS negative.     Past Medical History:  Diagnosis Date  . Biliary dyskinesia   . Endometriosis   . Heart murmur   . Hyperlipidemia   . Hypothyroidism   . Internal hemorrhoids   . Osteopenia    Past Surgical History:  Procedure Laterality Date  . APPENDECTOMY    . BREAST CYST ASPIRATION Left   . COLONOSCOPY WITH PROPOFOL N/A 05/28/2015   Procedure: COLONOSCOPY WITH PROPOFOL;  Surgeon: Manya Silvas, MD;  Location: [facility] ENDOSCOPY;  Service: Endoscopy;  Laterality: N/A;  . LAPAROSCOPIC SALPINGO OOPHERECTOMY Left   . OOPHORECTOMY     No Known Allergies No current facility-administered medications on file prior to encounter.    Current Outpatient Medications on File Prior to Encounter  Medication Sig Dispense Refill  . Calcium-Vitamin D (CALTRATE 600 PLUS-VIT D PO) Take by mouth.    . Estradiol 10 MCG TABS vaginal tablet Place one tablet vaginally 2 x a week at hs 24 tablet 3  . LEVOXYL 50 MCG tablet Take daily 90 tablet 3  . lidocaine (XYLOCAINE) 5 % ointment Apply a pea sized amount topically 20 minutes prior to intercourse, then wipe it off just prior to intercourse 30 g 1  . pravastatin (PRAVACHOL) 20 MG tablet Take 1 tablet (20 mg total) by mouth daily. 90 tablet 3   Social History   Socioeconomic History  . Marital status: Married    Spouse name: Not on file  . Number of children: Not on file  . Years of education: Not on file  . Highest  education level: Not on file  Occupational History  . Not on file  Social Needs  . Financial resource strain: Not on file  . Food insecurity    Worry: Not on file    Inability: Not on file  . Transportation needs    Medical: Not on file    Non-medical: Not on file  Tobacco Use  . Smoking status: Never Smoker  . Smokeless tobacco: Never Used  Substance and Sexual Activity  . Alcohol use: Yes    Alcohol/week: 1.0 standard drinks    Types: 1 Standard drinks or equivalent per week    Comment: Social  . Drug use: No  . Sexual activity: Yes    Partners: Male    Birth control/protection: Post-menopausal    Comment: Married to [employer], works at [employer]  . Physical activity    Days per week: Not on file    Minutes per session: Not on file  . Stress: Not on file  Relationships  . Social Herbalist on phone: Not on file    Gets together: Not on file    Attends religious service: Not on file    Active member of club or organization: Not on file    Attends meetings of clubs or organizations: Not on file    Relationship status: Not on file  . Intimate partner  violence    Fear of current or ex partner: Not on file    Emotionally abused: Not on file    Physically abused: Not on file    Forced sexual activity: Not on file  Other Topics Concern  . Not on file  Social History Narrative  . Not on file   Family History  Problem Relation Age of Onset  . Cancer Mother        lung  . Intracerebral hemorrhage Father   . Heart disease Father   . Diabetes Father   . Kidney disease Father   . Osteoporosis Sister 81  . Cancer Maternal Aunt        colon  . Osteoporosis Maternal Aunt   . Cancer Maternal Aunt        colon   . Pancreatic cancer Paternal Uncle   . Heart disease Maternal Grandfather   . Cancer Paternal Grandmother   . Pancreatic cancer Paternal Aunt   . Ovarian cancer Paternal Aunt 56     OBJECTIVE:  Vitals:   10/14/19 0830  BP: 137/86  Pulse:  80  Resp: 20  Temp: 98.3 F (36.8 C)  SpO2: 97%     General appearance: alert; no distress CV: Radial pulse 2+; cap refill <2 sec Hand: FROM Skin: laceration of distal fifth finger dorsal aspect; size: approx 1-2 cm Psychological: alert and cooperative; normal mood and affect  Procedure: Verbal consent obtained. Patient provided with risks and alternatives to the procedure. Wound copiously irrigated with NS then cleansed with betadine. Anesthetized with 4 mL of lidocaine without epinephrine. Wound carefully explored. No foreign body, tendon injury, or nonviable tissue were noted. Using sterile technique 3 interrupted and 1 horizontal 5-0 Prolene sutures were placed to reapproximate the wound. Patient tolerated procedure well. No complications. Minimal bleeding. Patient advised to look for and return for any signs of infection such as redness, swelling, discharge, or worsening pain. Return for suture removal in 10 days.  ASSESSMENT & PLAN:  1. Laceration of left little finger without foreign body without damage to nail, initial encounter     Meds ordered this encounter  Medications  . DISCONTD: cephALEXin (KEFLEX) 500 MG capsule    Sig: Take 1 capsule (500 mg total) by mouth 2 (two) times daily for 10 days.    Dispense:  20 capsule    Refill:  0    Order Specific Question:   Supervising Provider    Answer:   Raylene Everts [id number]  . cephALEXin (KEFLEX) 500 MG capsule    Sig: Take 1 capsule (500 mg total) by mouth 2 (two) times daily for 10 days.    Dispense:  20 capsule    Refill:  0    Order Specific Question:   Supervising Provider    Answer:   Raylene Everts [id number]   Bandage applied Keep covered for next and dry for next 24-48 hours.  After then you may gently clean with warm water and mild soap.  Avoid submerging wound in water. Change dressing daily and apply a thin layer of neosporin.  Keflex prescribed for infection prophylaxis given location of wound  Return in 10 days to have sutures removed.   Take OTC ibuprofen or tylenol as needed for pain releif Return sooner or go to the ED if you have any new or worsening symptoms such as increased pain, redness, swelling, drainage, discharge, decreased range of motion of extremity, etc..    Reviewed expectations re: course of current  medical issues. Questions answered. Outlined signs and symptoms indicating need for more acute intervention. Patient verbalized understanding. After Visit Summary given.   Lestine Box, PA-C 10/14/19 [phone]

## 2019-10-24 ENCOUNTER — Ambulatory Visit
Admission: EM | Admit: 2019-10-24 | Discharge: 2019-10-24 | Disposition: A | Discharge status: Home / Self Care | Payer: Managed Care, Other (non HMO)

## 2019-10-24 ENCOUNTER — Other Ambulatory Visit: Payer: Self-pay

## 2019-10-24 DIAGNOSIS — S61217D Laceration without foreign body of left little finger without damage to nail, subsequent encounter: Secondary | ICD-10-CM

## 2019-10-24 DIAGNOSIS — Z4802 Encounter for removal of sutures: Secondary | ICD-10-CM

## 2019-10-24 NOTE — ED Triage Notes (Signed)
Pt here for suture removal. 4 stiches removed , skin approximated

## 2019-12-09 ENCOUNTER — Ambulatory Visit: Payer: Managed Care, Other (non HMO) | Attending: Internal Medicine

## 2019-12-09 ENCOUNTER — Other Ambulatory Visit: Payer: Self-pay

## 2019-12-09 DIAGNOSIS — Z20822 Contact with and (suspected) exposure to covid-19: Secondary | ICD-10-CM

## 2019-12-10 LAB — NOVEL CORONAVIRUS, NAA: SARS-CoV-2, NAA: NOT DETECTED

## 2019-12-23 NOTE — Progress Notes (Signed)
59 y.o. G0P0000 Married White or Caucasian Not Hispanic or Latino female here for annual exam.    H/O dyspareunia, controlled with vaginal estrogen and topical lidocaine.     No LMP recorded. Patient is postmenopausal.          Sexually active: Yes.    The current method of family planning is post menopausal status.    Exercising: Yes.    Weights and walking Smoker:  no  Health Maintenance: Pap:  10-16-16 WNL NEG HR HPV 10-06-14 WNL History of abnormal Pap:  Yes years ago, no surgery on her cervix.   MMG:  06/17/19 bi-rads 1 neg density C  BMD:   06/16/19 Osteopenic t-score -1.6, FRAX 13/1.3% Colonoscopy: 05-28-15 WNL repeat in 5 yrs TDaP:  09/26/12     reports that she has never smoked. She has never used smokeless tobacco. She reports current alcohol use of about 1.0 standard drinks of alcohol per week. She reports that she does not use drugs. 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  Husband has his own business.  Past Medical History:  Diagnosis Date  . Biliary dyskinesia   . Endometriosis   . Heart murmur   . Hyperlipidemia   . Hypothyroidism   . Internal hemorrhoids   . Osteopenia     Past Surgical History:  Procedure Laterality Date  . APPENDECTOMY    . BREAST CYST ASPIRATION Left   . COLONOSCOPY WITH PROPOFOL N/A 05/28/2015   Procedure: COLONOSCOPY WITH PROPOFOL;  Surgeon: Manya Silvas, MD;  Location[facility] ENDOSCOPY;  Service: Endoscopy;  Laterality: N/A;  . LAPAROSCOPIC SALPINGO OOPHERECTOMY Left   . OOPHORECTOMY      Current Outpatient Medications  Medication Sig Dispense Refill  . Calcium-Vitamin D (CALTRATE 600 PLUS-VIT D PO) Take by mouth.    . Estradiol 10 MCG TABS vaginal tablet Place one tablet vaginally 2 x a week at hs 24 tablet 3  . LEVOXYL 50 MCG tablet Take daily 90 tablet 3  . lidocaine (XYLOCAINE) 5 % ointment Apply a pea sized amount topically 20 minutes prior to intercourse, then wipe it off  just prior to intercourse 30 g 1  . pravastatin (PRAVACHOL) 20 MG tablet Take 1 tablet (20 mg total) by mouth daily. 90 tablet 3   No current facility-administered medications for this visit.    Family History  Problem Relation Age of Onset  . Cancer Mother        lung  . Intracerebral hemorrhage Father   . Heart disease Father   . Diabetes Father   . Kidney disease Father   . Osteoporosis Sister 5  . Cancer Maternal Aunt        colon  . Osteoporosis Maternal Aunt   . Cancer Maternal Aunt        colon   . Pancreatic cancer Paternal Uncle   . Heart disease Maternal Grandfather   . Cancer Paternal Grandmother   . Pancreatic cancer Paternal Aunt   . Ovarian cancer Paternal Aunt 11    Review of Systems  All other systems reviewed and are negative.   Exam:   BP 124/62   Pulse 80   Temp 98.9 F (37.2 C)   Ht 5' 1.5" (1.562 m)   Wt 120 lb 3.2 oz (54.5 kg)   SpO2 99%   BMI 22.34 kg/m   Weight change: @WEIGHTCHANGE @ Height:   Height: 5' 1.5" (156.2 cm)  Ht Readings from  Last 3 Encounters:  12/30/19 5' 1.5" (1.562 m)  04/29/19 5\' 2"  (1.575 m)  12/11/18 5' 1.5" (1.562 m)    General appearance: alert, cooperative and appears stated age Head: Normocephalic, without obvious abnormality, atraumatic Neck: no adenopathy, supple, symmetrical, trachea midline and thyroid normal to inspection and palpation Lungs: clear to auscultation bilaterally Cardiovascular: regular rate and rhythm Breasts: normal appearance, no masses or tenderness Abdomen: soft, non-tender; non distended,  no masses,  no organomegaly Extremities: extremities normal, atraumatic, no cyanosis or edema Skin: Skin color, texture, turgor normal. No rashes or lesions Lymph nodes: Cervical, supraclavicular, and axillary nodes normal. No abnormal inguinal nodes palpated Neurologic: Grossly normal   Pelvic: External genitalia:  no lesions              Urethra:  normal appearing urethra with no masses,  tenderness or lesions              Bartholins and Skenes: normal                 Vagina: mildly atrophic appearing vagina with normal color and discharge, no lesions              Cervix: no lesions               Bimanual Exam:  Uterus:  normal size, contour, position, consistency, mobility, non-tender              Adnexa: no mass, fullness, tenderness               Rectovaginal: Confirms               Anus:  normal sphincter tone, no lesions  Gae Dry chaperoned for the exam.  A:  Well Woman with normal exam  FH of colon cancer  Osteopenia  H/O vit d def  P:   Pap with hpv (patient desires)  Vit D  Screening labs with primary  Discussed breast self exam  Discussed calcium and vit D intake  Colonoscopy later this year  Mammogram in 8/21

## 2019-12-25 ENCOUNTER — Ambulatory Visit: Payer: Managed Care, Other (non HMO) | Admitting: Obstetrics and Gynecology

## 2019-12-30 ENCOUNTER — Encounter: Payer: Self-pay | Admitting: Obstetrics and Gynecology

## 2019-12-30 ENCOUNTER — Ambulatory Visit: Payer: Managed Care, Other (non HMO) | Admitting: Obstetrics and Gynecology

## 2019-12-30 ENCOUNTER — Other Ambulatory Visit: Payer: Self-pay

## 2019-12-30 VITALS — BP 124/62 | HR 80 | Temp 98.9°F | Ht 61.5 in | Wt 120.2 lb

## 2019-12-30 DIAGNOSIS — M8580 Other specified disorders of bone density and structure, unspecified site: Secondary | ICD-10-CM

## 2019-12-30 DIAGNOSIS — Z800 Family history of malignant neoplasm of digestive organs: Secondary | ICD-10-CM

## 2019-12-30 DIAGNOSIS — Z01419 Encounter for gynecological examination (general) (routine) without abnormal findings: Secondary | ICD-10-CM | POA: Diagnosis not present

## 2019-12-30 DIAGNOSIS — Z8639 Personal history of other endocrine, nutritional and metabolic disease: Secondary | ICD-10-CM

## 2019-12-30 DIAGNOSIS — N393 Stress incontinence (female) (male): Secondary | ICD-10-CM

## 2019-12-30 DIAGNOSIS — N952 Postmenopausal atrophic vaginitis: Secondary | ICD-10-CM | POA: Diagnosis not present

## 2019-12-30 DIAGNOSIS — Z124 Encounter for screening for malignant neoplasm of cervix: Secondary | ICD-10-CM

## 2019-12-30 DIAGNOSIS — E7800 Pure hypercholesterolemia, unspecified: Secondary | ICD-10-CM | POA: Insufficient documentation

## 2019-12-30 MED ORDER — LIDOCAINE 5 % EX OINT: TOPICAL_OINTMENT | CUTANEOUS | 1 refills | Status: DC

## 2019-12-30 MED ORDER — ESTRADIOL 10 MCG VA TABS: ORAL_TABLET | VAGINAL | 3 refills | Status: DC

## 2019-12-30 NOTE — Addendum Note (Signed)
Addended by: Dorothy Spark on: 12/30/2019 01:12 PM   Modules accepted: Orders

## 2019-12-30 NOTE — Patient Instructions (Signed)
Osteopenia  Osteopenia is a loss of thickness (density) inside of the bones. Another name for osteopenia is low bone mass. Mild osteopenia is a normal part of aging. It is not a disease, and it does not cause symptoms. However, if you have osteopenia and continue to lose bone mass, you could develop a condition that causes the bones to become thin and break more easily (osteoporosis). You may also lose some height, have back pain, and have a stooped posture. Although osteopenia is not a disease, making changes to your lifestyle and diet can help to prevent osteopenia from developing into osteoporosis. What are the causes? Osteopenia is caused by loss of calcium in the bones.  Bones are constantly changing. Old bone cells are continually being replaced with new bone cells. This process builds new bone. The mineral calcium is needed to build new bone and maintain bone density. Bone density is usually highest around age 35. After that, most people's bodies cannot replace all the bone they have lost with new bone. What increases the risk? You are more likely to develop this condition if:  You are older than age 50.  You are a woman who went through menopause early.  You have a long illness that keeps you in bed.  You do not get enough exercise.  You lack certain nutrients (malnutrition).  You have an overactive thyroid gland (hyperthyroidism).  You smoke.  You drink a lot of alcohol.  You are taking medicines that weaken the bones, such as steroids. What are the signs or symptoms? This condition does not cause any symptoms. You may have a slightly higher risk for bone breaks (fractures), so getting fractures more easily than normal may be an indication of osteopenia. How is this diagnosed? Your health care provider can diagnose this condition with a special type of X-ray exam that measures bone density (dual-energy X-ray absorptiometry, DEXA). This test can measure bone density in your  hips, spine, and wrists. Osteopenia has no symptoms, so this condition is usually diagnosed after a routine bone density screening test is done for osteoporosis. This routine screening is usually done for:  Women who are age 65 or older.  Men who are age 70 or older. If you have risk factors for osteopenia, you may have the screening test at an earlier age. How is this treated? Making dietary and lifestyle changes can lower your risk for osteoporosis. If you have severe osteopenia that is close to becoming osteoporosis, your health care provider may prescribe medicines and dietary supplements such as calcium and vitamin D. These supplements help to rebuild bone density. Follow these instructions at home:   Take over-the-counter and prescription medicines only as told by your health care provider. These include vitamins and supplements.  Eat a diet that is high in calcium and vitamin D. ? Calcium is found in dairy products, beans, salmon, and leafy green vegetables like spinach and broccoli. ? Look for foods that have vitamin D and calcium added to them (fortified foods), such as orange juice, cereal, and bread.  Do 30 or more minutes of a weight-bearing exercise every day, such as walking, jogging, or playing a sport. These types of exercises strengthen the bones.  Take precautions at home to lower your risk of falling, such as: ? Keeping rooms well-lit and free of clutter, such as cords. ? Installing safety rails on stairs. ? Using rubber mats in the bathroom or other areas that are often wet or slippery.  Do not use   any products that contain nicotine or tobacco, such as cigarettes and e-cigarettes. If you need help quitting, ask your health care provider.  Avoid alcohol or limit alcohol intake to no more than 1 drink a day for nonpregnant women and 2 drinks a day for men. One drink equals 12 oz of beer, 5 oz of wine, or 1 oz of hard liquor.  Keep all follow-up visits as told by your  health care provider. This is important. Contact a health care provider if:  You have not had a bone density screening for osteoporosis and you are: ? A woman, age 70 or older. ? A man, age 72 or older.  You are a postmenopausal woman who has not had a bone density screening for osteoporosis.  You are older than age 36 and you want to know if you should have bone density screening for osteoporosis. Summary  Osteopenia is a loss of thickness (density) inside of the bones. Another name for osteopenia is low bone mass.  Osteopenia is not a disease, but it may increase your risk for a condition that causes the bones to become thin and break more easily (osteoporosis).  You may be at risk for osteopenia if you are older than age 10 or if you are a woman who went through early menopause.  Osteopenia does not cause any symptoms, but it can be diagnosed with a bone density screening test.  Dietary and lifestyle changes are the first treatment for osteopenia. These may lower your risk for osteoporosis. This information is not intended to replace advice given to you by your health care provider. Make sure you discuss any questions you have with your health care provider. Document Revised: 10/05/2017 Document Reviewed: 08/01/2017 Elsevier Patient Education  [place] DIET:  We recommended that you start or continue a regular exercise program for good health. Regular exercise means any activity that makes your heart beat faster and makes you sweat.  We recommend exercising at least 30 minutes per day at least 3 days a week, preferably 4 or 5.  We also recommend a diet low in fat and sugar.  Inactivity, poor dietary choices and obesity can cause diabetes, heart attack, stroke, and kidney damage, among others.    ALCOHOL AND SMOKING:  Women should limit their alcohol intake to no more than 7 drinks/beers/glasses of wine (combined, not each!) per week. Moderation of alcohol  intake to this level decreases your risk of breast cancer and liver damage. And of course, no recreational drugs are part of a healthy lifestyle.  And absolutely no smoking or even second hand smoke. Most people know smoking can cause heart and lung diseases, but did you know it also contributes to weakening of your bones? Aging of your skin?  Yellowing of your teeth and nails?  CALCIUM AND VITAMIN D:  Adequate intake of calcium and Vitamin D are recommended.  The recommendations for exact amounts of these supplements seem to change often, but generally speaking 1,200 mg of calcium (between diet and supplement) and 800 units of Vitamin D per day seems prudent. Certain women may benefit from higher intake of Vitamin D.  If you are among these women, your doctor will have told you during your visit.    PAP SMEARS:  Pap smears, to check for cervical cancer or precancers,  have traditionally been done yearly, although recent scientific advances have shown that most women can have pap smears less often.  However, every woman  still should have a physical exam from her gynecologist every year. It will include a breast check, inspection of the vulva and vagina to check for abnormal growths or skin changes, a visual exam of the cervix, and then an exam to evaluate the size and shape of the uterus and ovaries.  And after 60 years of age, a rectal exam is indicated to check for rectal cancers. We will also provide age appropriate advice regarding health maintenance, like when you should have certain vaccines, screening for sexually transmitted diseases, bone density testing, colonoscopy, mammograms, etc.   MAMMOGRAMS:  All women over 35 years old should have a yearly mammogram. Many facilities now offer a "3D" mammogram, which may cost around $50 extra out of pocket. If possible,  we recommend you accept the option to have the 3D mammogram performed.  It both reduces the number of women who will be called back for extra  views which then turn out to be normal, and it is better than the routine mammogram at detecting truly abnormal areas.    COLON CANCER SCREENING: Now recommend starting at age 46. At this time colonoscopy is not covered for routine screening until 50. There are take home tests that can be done between 45-49.   COLONOSCOPY:  Colonoscopy to screen for colon cancer is recommended for all women at age 70.  We know, you hate the idea of the prep.  We agree, BUT, having colon cancer and not knowing it is worse!!  Colon cancer so often starts as a polyp that can be seen and removed at colonscopy, which can quite literally save your life!  And if your first colonoscopy is normal and you have no family history of colon cancer, most women don't have to have it again for 10 years.  Once every ten years, you can do something that may end up saving your life, right?  We will be happy to help you get it scheduled when you are ready.  Be sure to check your insurance coverage so you understand how much it will cost.  It may be covered as a preventative service at no cost, but you should check your particular policy.      Breast Self-Awareness Breast self-awareness means being familiar with how your breasts look and feel. It involves checking your breasts regularly and reporting any changes to your health care provider. Practicing breast self-awareness is important. A change in your breasts can be a sign of a serious medical problem. Being familiar with how your breasts look and feel allows you to find any problems early, when treatment is more likely to be successful. All women should practice breast self-awareness, including women who have had breast implants. How to do a breast self-exam One way to learn what is normal for your breasts and whether your breasts are changing is to do a breast self-exam. To do a breast self-exam: Look for Changes  1. Remove all the clothing above your waist. 2. Stand in front of a  mirror in a room with good lighting. 3. Put your hands on your hips. 4. Push your hands firmly downward. 5. Compare your breasts in the mirror. Look for differences between them (asymmetry), such as: ? Differences in shape. ? Differences in size. ? Puckers, dips, and bumps in one breast and not the other. 6. Look at each breast for changes in your skin, such as: ? Redness. ? Scaly areas. 7. Look for changes in your nipples, such as: ? Discharge. ?  Bleeding. ? Dimpling. ? Redness. ? A change in position. Feel for Changes Carefully feel your breasts for lumps and changes. It is best to do this while lying on your back on the floor and again while sitting or standing in the shower or tub with soapy water on your skin. Feel each breast in the following way:  Place the arm on the side of the breast you are examining above your head.  Feel your breast with the other hand.  Start in the nipple area and make  inch (2 cm) overlapping circles to feel your breast. Use the pads of your three middle fingers to do this. Apply light pressure, then medium pressure, then firm pressure. The light pressure will allow you to feel the tissue closest to the skin. The medium pressure will allow you to feel the tissue that is a little deeper. The firm pressure will allow you to feel the tissue close to the ribs.  Continue the overlapping circles, moving downward over the breast until you feel your ribs below your breast.  Move one finger-width toward the center of the body. Continue to use the  inch (2 cm) overlapping circles to feel your breast as you move slowly up toward your collarbone.  Continue the up and down exam using all three pressures until you reach your armpit.  Write Down What You Find  Write down what is normal for each breast and any changes that you find. Keep a written record with breast changes or normal findings for each breast. By writing this information down, you do not need to  depend only on memory for size, tenderness, or location. Write down where you are in your menstrual cycle, if you are still menstruating. If you are having trouble noticing differences in your breasts, do not get discouraged. With time you will become more familiar with the variations in your breasts and more comfortable with the exam. How often should I examine my breasts? Examine your breasts every month. If you are breastfeeding, the best time to examine your breasts is after a feeding or after using a breast pump. If you menstruate, the best time to examine your breasts is 5-7 days after your period is over. During your period, your breasts are lumpier, and it may be more difficult to notice changes. When should I see my health care provider? See your health care provider if you notice:  A change in shape or size of your breasts or nipples.  A change in the skin of your breast or nipples, such as a reddened or scaly area.  Unusual discharge from your nipples.  A lump or thick area that was not there before.  Pain in your breasts.  Anything that concerns you.

## 2019-12-31 LAB — VITAMIN D 25 HYDROXY (VIT D DEFICIENCY, FRACTURES): Vit D, 25-Hydroxy: 35.2 ng/mL (ref 30.0–100.0)

## 2020-01-01 LAB — IGP, APTIMA HPV: HPV Aptima: NEGATIVE

## 2020-04-22 ENCOUNTER — Other Ambulatory Visit: Payer: Self-pay | Admitting: Family Medicine

## 2020-05-13 ENCOUNTER — Ambulatory Visit (INDEPENDENT_AMBULATORY_CARE_PROVIDER_SITE_OTHER): Payer: Managed Care, Other (non HMO) | Admitting: Family Medicine

## 2020-05-13 ENCOUNTER — Encounter: Payer: Self-pay | Admitting: Family Medicine

## 2020-05-13 ENCOUNTER — Other Ambulatory Visit: Payer: Self-pay

## 2020-05-13 VITALS — BP 110/70 | HR 84 | Temp 97.0°F | Wt 118.0 lb

## 2020-05-13 DIAGNOSIS — E7800 Pure hypercholesterolemia, unspecified: Secondary | ICD-10-CM

## 2020-05-13 DIAGNOSIS — E038 Other specified hypothyroidism: Secondary | ICD-10-CM

## 2020-05-13 DIAGNOSIS — Z1211 Encounter for screening for malignant neoplasm of colon: Secondary | ICD-10-CM

## 2020-05-13 DIAGNOSIS — Z780 Asymptomatic menopausal state: Secondary | ICD-10-CM

## 2020-05-13 DIAGNOSIS — Z0000 Encounter for general adult medical examination without abnormal findings: Secondary | ICD-10-CM

## 2020-05-13 DIAGNOSIS — Z0001 Encounter for general adult medical examination with abnormal findings: Secondary | ICD-10-CM | POA: Diagnosis not present

## 2020-05-13 MED ORDER — PRAVASTATIN SODIUM 20 MG PO TABS: ORAL_TABLET | ORAL | 3 refills | Status: DC

## 2020-05-13 MED ORDER — LEVOXYL 50 MCG PO TABS: ORAL_TABLET | ORAL | 3 refills | Status: DC

## 2020-05-13 NOTE — Progress Notes (Signed)
Subjective:    Patient ID: Natasha Hawkins, female    DOB: April 29, 1961, 59 y.o.   MRN: [id number]  HPI Patient is a 59 year old Caucasian female here today for complete physical exam.  DEXA 2020 revealed T score of -1.6 showing osteopenia.  Last mammogram was 8/20 and is coming due.  Last colonoscopy was 7/16 and Dr. Vira Agar recommended repeat colonoscopy in 5 years.  Patient brings in lab work today.  CBC is completely normal.  CMP shows a creatinine of 1.06 which is elevated from 0.96 from last year.  GFR is calculated to be 58 and consistent with stage III chronic kidney disease.  The remainder of her CMP including her liver function test and glucose are normal.  Total cholesterol is 190.  Triglycerides are 62.  HDL 67.  LDL is 111.  TSH is mildly elevated at 4.51.  Past Medical History:  Diagnosis Date  . Biliary dyskinesia   . Endometriosis   . Heart murmur   . Hyperlipidemia   . Hypothyroidism   . Internal hemorrhoids   . Osteopenia   . Thyroid disease    Phreesia 05/12/2020   Past Surgical History:  Procedure Laterality Date  . APPENDECTOMY    . BREAST CYST ASPIRATION Left   . COLONOSCOPY WITH PROPOFOL N/A 05/28/2015   Procedure: COLONOSCOPY WITH PROPOFOL;  Surgeon: Manya Silvas, MD;  Location: [facility] ENDOSCOPY;  Service: Endoscopy;  Laterality: N/A;  . FRACTURE SURGERY N/A    Phreesia 05/12/2020  . LAPAROSCOPIC SALPINGO OOPHERECTOMY Left   . OOPHORECTOMY     Current Outpatient Medications on File Prior to Visit  Medication Sig Dispense Refill  . Calcium-Vitamin D (CALTRATE 600 PLUS-VIT D PO) Take by mouth.    . Estradiol 10 MCG TABS vaginal tablet Place one tablet vaginally 2 x a week at hs 24 tablet 3  . LEVOXYL 50 MCG tablet TAKE 1 TABLET BY MOUTH EVERY DAY 30 tablet 0  . lidocaine (XYLOCAINE) 5 % ointment Apply a pea sized amount topically 20 minutes prior to intercourse, then wipe it off just prior to intercourse 30 g 1  . pravastatin (PRAVACHOL) 20 MG tablet TAKE 1  TABLET(20 MG) BY MOUTH DAILY 30 tablet 0   No current facility-administered medications on file prior to visit.   No Known Allergies Social History   Socioeconomic History  . Marital status: Married    Spouse name: Not on file  . Number of children: Not on file  . Years of education: Not on file  . Highest education level: Not on file  Occupational History  . Not on file  Tobacco Use  . Smoking status: Never Smoker  . Smokeless tobacco: Never Used  Vaping Use  . Vaping Use: Never used  Substance and Sexual Activity  . Alcohol use: Yes    Alcohol/week: 1.0 standard drink    Types: 1 Standard drinks or equivalent per week    Comment: Social  . Drug use: No  . Sexual activity: Yes    Partners: Male    Birth control/protection: Post-menopausal    Comment: Married to [employer], works at [employer]  Other Topics Concern  . Not on file  Social History Narrative  . Not on file   Social Determinants of Health   Financial Resource Strain:   . Difficulty of Paying Living Expenses:   Food Insecurity:   . Worried About Charity fundraiser in the Last Year:   . [place] in the Last  Year:   Transportation Needs:   . Film/video editor (Medical):   Marland Kitchen Lack of Transportation (Non-Medical):   Physical Activity:   . Days of Exercise per Week:   . Minutes of Exercise per Session:   Stress:   . Feeling of Stress :   Social Connections:   . Frequency of Communication with Friends and Family:   . Frequency of Social Gatherings with Friends and Family:   . Attends Religious Services:   . Active Member of Clubs or Organizations:   . Attends Archivist Meetings:   Marland Kitchen Marital Status:   Intimate Partner Violence:   . Fear of Current or Ex-Partner:   . Emotionally Abused:   Marland Kitchen Physically Abused:   . Sexually Abused:    Family History  Problem Relation Age of Onset  . Cancer Mother        lung  . Intracerebral hemorrhage Father   . Heart disease Father   . Diabetes  Father   . Kidney disease Father   . Osteoporosis Sister 22  . Cancer Maternal Aunt        colon  . Osteoporosis Maternal Aunt   . Cancer Maternal Aunt        colon   . Pancreatic cancer Paternal Uncle   . Heart disease Maternal Grandfather   . Cancer Paternal Grandmother   . Pancreatic cancer Paternal Aunt   . Ovarian cancer Paternal Aunt 89      Review of Systems  All other systems reviewed and are negative.      Objective:   Physical Exam Vitals reviewed.  Constitutional:      General: She is not in acute distress.    Appearance: She is well-developed. She is not diaphoretic.  HENT:     Head: Normocephalic and atraumatic.     Right Ear: External ear normal.     Left Ear: External ear normal.     Nose: Nose normal.     Mouth/Throat:     Pharynx: No oropharyngeal exudate.  Eyes:     General: No scleral icterus.       Right eye: No discharge.        Left eye: No discharge.     Conjunctiva/sclera: Conjunctivae normal.     Pupils: Pupils are equal, round, and reactive to light.  Neck:     Thyroid: No thyromegaly.     Vascular: No JVD.     Trachea: No tracheal deviation.  Cardiovascular:     Rate and Rhythm: Normal rate and regular rhythm.     Heart sounds: Normal heart sounds. No murmur heard.  No friction rub. No gallop.   Pulmonary:     Effort: Pulmonary effort is normal. No respiratory distress.     Breath sounds: Normal breath sounds. No stridor. No wheezing or rales.  Chest:     Chest wall: No tenderness.  Abdominal:     General: Bowel sounds are normal. There is no distension.     Palpations: Abdomen is soft. There is no mass.     Tenderness: There is no abdominal tenderness. There is no guarding or rebound.  Musculoskeletal:        General: No tenderness. Normal range of motion.     Cervical back: Normal range of motion and neck supple.  Lymphadenopathy:     Cervical: No cervical adenopathy.  Skin:    General: Skin is warm.     Coloration: Skin is  not pale.  Findings: No erythema or rash.  Neurological:     Mental Status: She is alert and oriented to person, place, and time.     Cranial Nerves: No cranial nerve deficit.     Motor: No abnormal muscle tone.     Coordination: Coordination normal.     Deep Tendon Reflexes: Reflexes are normal and symmetric.  Psychiatric:        Behavior: Behavior normal.        Thought Content: Thought content normal.        Judgment: Judgment normal.        Assessment & Plan:   Colon cancer screening - Plan: Ambulatory referral to Gastroenterology  Postmenopausal estrogen deficiency  Other specified hypothyroidism  Pure hypercholesterolemia  Routine general medical examination at a health care facility  Given the rise in her creatinine, I would like to recheck her BMP in 6 months. Also recheck tsh in 6 months.  I encouraged her to drink more water and to avoid NSAIDs.  Cholesterol is acceptable.  Strongly recommended the Covid vaccination.  Also recommended Shingrix.  Repeat bone density test next year.  Encouraged her to schedule her mammogram.  I will schedule the patient for a colonoscopy.  The remainder of her preventative care is up-to-date.  Regular anticipatory guidance is provided.

## 2020-05-13 NOTE — Addendum Note (Signed)
Addended by: Jenna Luo T on: 05/13/2020 10:28 AM   Modules accepted: Orders

## 2020-06-09 ENCOUNTER — Telehealth (INDEPENDENT_AMBULATORY_CARE_PROVIDER_SITE_OTHER): Payer: Managed Care, Other (non HMO) | Admitting: Gastroenterology

## 2020-06-09 ENCOUNTER — Other Ambulatory Visit: Payer: Self-pay

## 2020-06-09 DIAGNOSIS — Z1211 Encounter for screening for malignant neoplasm of colon: Secondary | ICD-10-CM

## 2020-06-09 MED ORDER — NA SULFATE-K SULFATE-MG SULF 17.5-3.13-1.6 GM/177ML PO SOLN: 1.0000 | Freq: Once | ORAL | 0 refills | Status: AC

## 2020-06-09 NOTE — Progress Notes (Signed)
Gastroenterology Pre-Procedure Review  Request Date: Tuesday 08/03/20 Requesting Physician: Dr. Allen Norris  PATIENT REVIEW QUESTIONS: The patient responded to the following health history questions as indicated:    1. Are you having any GI issues? no 2. Do you have a personal history of Polyps? no 3. Do you have a family history of Colon Cancer or Polyps? yes (maternal aunts (2) had colon cancer, Maternal aunt colon cancer) 4. Diabetes Mellitus? no 5. Joint replacements in the past 12 months?no 6. Major health problems in the past 3 months?no 7. Any artificial heart valves, MVP, or defibrillator?no    MEDICATIONS & ALLERGIES:    Patient reports the following regarding taking any anticoagulation/antiplatelet therapy:   Plavix, Coumadin, Eliquis, Xarelto, Lovenox, Pradaxa, Brilinta, or Effient? no Aspirin? no  Patient confirms/reports the following medications:  Current Outpatient Medications  Medication Sig Dispense Refill  . Calcium-Vitamin D (CALTRATE 600 PLUS-VIT D PO) Take by mouth.    . Estradiol 10 MCG TABS vaginal tablet Place one tablet vaginally 2 x a week at hs 24 tablet 3  . LEVOXYL 50 MCG tablet TAKE 1 TABLET BY MOUTH EVERY DAY 90 tablet 3  . lidocaine (XYLOCAINE) 5 % ointment Apply a pea sized amount topically 20 minutes prior to intercourse, then wipe it off just prior to intercourse 30 g 1  . pravastatin (PRAVACHOL) 20 MG tablet TAKE 1 TABLET(20 MG) BY MOUTH DAILY 90 tablet 3  . Na Sulfate-K Sulfate-Mg Sulf 17.5-3.13-1.6 GM/177ML SOLN Take 1 kit by mouth once for 1 dose. 354 mL 0   No current facility-administered medications for this visit.    Patient confirms/reports the following allergies:  No Known Allergies  No orders of the defined types were placed in this encounter.   AUTHORIZATION INFORMATION Primary Insurance: 1D#: Group #:  Secondary Insurance: 1D#: Group #:  SCHEDULE INFORMATION: Date: 08/03/20 Time: Location:[facility]

## 2020-06-17 ENCOUNTER — Ambulatory Visit: Payer: Managed Care, Other (non HMO) | Attending: Internal Medicine

## 2020-06-17 DIAGNOSIS — Z23 Encounter for immunization: Secondary | ICD-10-CM

## 2020-06-17 NOTE — Progress Notes (Signed)
° °  Covid-19 Vaccination Clinic  Name:  Natasha Hawkins    MRN: [id number] DOB: 08-Jul-1961  06/17/2020  [name] was observed post Covid-19 immunization for 15 minutes without incident. She was provided with Vaccine Information Sheet and instruction to access the V-Safe system.   [name] was instructed to call 911 with any severe reactions post vaccine:  Difficulty breathing   Swelling of face and throat   A fast heartbeat   A bad rash all over body   Dizziness and weakness   Immunizations Administered    Name Date Dose VIS Date Route   Pfizer COVID-19 Vaccine 06/17/2020 10:38 AM 0.3 mL 12/31/2018 Intramuscular   Manufacturer: [place]   Lot: [id number]   [place]: [address]-3

## 2020-07-08 ENCOUNTER — Ambulatory Visit: Payer: Managed Care, Other (non HMO) | Attending: Internal Medicine

## 2020-07-08 DIAGNOSIS — Z23 Encounter for immunization: Secondary | ICD-10-CM

## 2020-07-08 NOTE — Progress Notes (Signed)
° °  Covid-19 Vaccination Clinic  Name:  MALGORZATA ALBERT    MRN: [id number] DOB: [birth year]  07/08/2020  Ms. Mikus was observed post Covid-19 immunization for 15 minutes without incident. She was provided with Vaccine Information Sheet and instruction to access the V-Safe system.   Ms. Randhawa was instructed to call 911 with any severe reactions post vaccine:  Difficulty breathing   Swelling of face and throat   A fast heartbeat   A bad rash all over body   Dizziness and weakness   Immunizations Administered    Name Date Dose VIS Date Route   Pfizer COVID-19 Vaccine 07/08/2020 11:10 AM 0.3 mL 12/31/2018 Intramuscular   Manufacturer: [place]   Lot: [id number]   [place]: [address]-3

## 2020-07-30 ENCOUNTER — Other Ambulatory Visit: Payer: Self-pay

## 2020-07-30 ENCOUNTER — Other Ambulatory Visit
Admission: RE | Admit: 2020-07-30 | Discharge: 2020-07-30 | Disposition: A | Discharge status: Home / Self Care | Payer: Managed Care, Other (non HMO) | Source: Ambulatory Visit | Attending: Gastroenterology | Admitting: Gastroenterology

## 2020-07-30 DIAGNOSIS — Z01812 Encounter for preprocedural laboratory examination: Secondary | ICD-10-CM | POA: Insufficient documentation

## 2020-07-30 DIAGNOSIS — Z20822 Contact with and (suspected) exposure to covid-19: Secondary | ICD-10-CM | POA: Diagnosis not present

## 2020-07-31 LAB — SARS CORONAVIRUS 2 (TAT 6-24 HRS): SARS Coronavirus 2: NEGATIVE

## 2020-08-03 ENCOUNTER — Ambulatory Visit: Payer: Managed Care, Other (non HMO) | Admitting: Anesthesiology

## 2020-08-03 ENCOUNTER — Encounter
Admission: RE | Disposition: A | Discharge status: Home / Self Care | Payer: Self-pay | Source: Home / Self Care | Attending: Gastroenterology

## 2020-08-03 ENCOUNTER — Ambulatory Visit
Admission: RE | Admit: 2020-08-03 | Discharge: 2020-08-03 | Disposition: A | Discharge status: Home / Self Care | Payer: Managed Care, Other (non HMO) | Attending: Gastroenterology | Admitting: Gastroenterology

## 2020-08-03 ENCOUNTER — Encounter: Payer: Self-pay | Admitting: Gastroenterology

## 2020-08-03 DIAGNOSIS — Z8262 Family history of osteoporosis: Secondary | ICD-10-CM | POA: Insufficient documentation

## 2020-08-03 DIAGNOSIS — E785 Hyperlipidemia, unspecified: Secondary | ICD-10-CM | POA: Diagnosis not present

## 2020-08-03 DIAGNOSIS — Z86010 Personal history of colonic polyps: Secondary | ICD-10-CM | POA: Diagnosis present

## 2020-08-03 DIAGNOSIS — Z1211 Encounter for screening for malignant neoplasm of colon: Secondary | ICD-10-CM | POA: Insufficient documentation

## 2020-08-03 DIAGNOSIS — M8580 Other specified disorders of bone density and structure, unspecified site: Secondary | ICD-10-CM | POA: Diagnosis not present

## 2020-08-03 DIAGNOSIS — Z9049 Acquired absence of other specified parts of digestive tract: Secondary | ICD-10-CM | POA: Insufficient documentation

## 2020-08-03 DIAGNOSIS — Z79899 Other long term (current) drug therapy: Secondary | ICD-10-CM | POA: Insufficient documentation

## 2020-08-03 DIAGNOSIS — E039 Hypothyroidism, unspecified: Secondary | ICD-10-CM | POA: Insufficient documentation

## 2020-08-03 HISTORY — PX: COLONOSCOPY WITH PROPOFOL: SHX5780

## 2020-08-03 HISTORY — DX: Other congenital malformations of bile ducts: Q44.5

## 2020-08-03 SURGERY — COLONOSCOPY WITH PROPOFOL
Anesthesia: General

## 2020-08-03 MED ORDER — SODIUM CHLORIDE 0.9 % IV SOLN: INTRAVENOUS | Status: DC

## 2020-08-03 MED ORDER — LIDOCAINE HCL (CARDIAC) PF 100 MG/5ML IV SOSY
PREFILLED_SYRINGE | INTRAVENOUS | Status: DC | PRN
  Administered 2020-08-03: 50 mg via INTRAVENOUS

## 2020-08-03 MED ORDER — PROPOFOL 500 MG/50ML IV EMUL
INTRAVENOUS | Status: DC | PRN
  Administered 2020-08-03: 100 ug/kg/min via INTRAVENOUS

## 2020-08-03 MED ORDER — PROPOFOL 500 MG/50ML IV EMUL
INTRAVENOUS | Status: AC
  Filled 2020-08-03: qty 150

## 2020-08-03 MED ORDER — PROPOFOL 10 MG/ML IV BOLUS
INTRAVENOUS | Status: DC | PRN
  Administered 2020-08-03: 50 mg via INTRAVENOUS

## 2020-08-03 MED ORDER — PROPOFOL 10 MG/ML IV BOLUS
INTRAVENOUS | Status: AC
  Filled 2020-08-03: qty 20

## 2020-08-03 NOTE — Transfer of Care (Signed)
Immediate Anesthesia Transfer of Care Note  Patient: Natasha Hawkins  Procedure(s) Performed: COLONOSCOPY WITH PROPOFOL (N/A )  Patient Location: PACU  Anesthesia Type:MAC  Level of Consciousness: awake, alert  and oriented  Airway & Oxygen Therapy: Patient Spontanous Breathing  Post-op Assessment: Report given to RN and Post -op Vital signs reviewed and stable  Post vital signs: Reviewed and stable  Last Vitals:  Vitals Value Taken Time  BP 122/70 08/03/20 0818  Temp    Pulse 69 08/03/20 0818  Resp 19 08/03/20 0818  SpO2 100 % 08/03/20 0818  Vitals shown include unvalidated device data.  Last Pain:  Vitals:   08/03/20 0719  TempSrc: Temporal  PainSc: 0-No pain         Complications: No complications documented.

## 2020-08-03 NOTE — H&P (Signed)
Lucilla Lame, MD [facility] [address], [place], [place] [address] Phone: [phone] Fax : [phone]  Primary Care Physician:  Susy Frizzle, MD Primary Gastroenterologist:  Dr. Allen Norris  Pre-Procedure History & Physical: HPI:  Natasha Hawkins is a 59 y.o. female is here for a screening colonoscopy.   Past Medical History:  Diagnosis Date  . Biliary anomaly   . Biliary dyskinesia   . Endometriosis   . Heart murmur   . Hyperlipidemia   . Hypothyroidism   . Internal hemorrhoids   . Osteopenia   . Thyroid disease    Phreesia 05/12/2020    Past Surgical History:  Procedure Laterality Date  . APPENDECTOMY    . BREAST CYST ASPIRATION Left   . COLONOSCOPY WITH PROPOFOL N/A 05/28/2015   Procedure: COLONOSCOPY WITH PROPOFOL;  Surgeon: Manya Silvas, MD;  Location: [facility] ENDOSCOPY;  Service: Endoscopy;  Laterality: N/A;  . FRACTURE SURGERY N/A    Phreesia 05/12/2020  . LAPAROSCOPIC SALPINGO OOPHERECTOMY Left   . OOPHORECTOMY      Prior to Admission medications   Medication Sig Start Date End Date Taking? Authorizing Provider  LEVOXYL 50 MCG tablet TAKE 1 TABLET BY MOUTH EVERY DAY 05/13/20  Yes Susy Frizzle, MD  lidocaine (XYLOCAINE) 5 % ointment Apply a pea sized amount topically 20 minutes prior to intercourse, then wipe it off just prior to intercourse 12/30/19  Yes [name], MD  pravastatin (PRAVACHOL) 20 MG tablet TAKE 1 TABLET(20 MG) BY MOUTH DAILY 05/13/20  Yes Susy Frizzle, MD  Calcium-Vitamin D (CALTRATE 600 PLUS-VIT D PO) Take by mouth.    [provider]  Estradiol 10 MCG TABS vaginal tablet Place one tablet vaginally 2 x a week at hs 12/30/19   [name], MD    Allergies as of 06/10/2020  . (No Known Allergies)    Family History  Problem Relation Age of Onset  . Cancer Mother        lung  . Intracerebral hemorrhage Father   . Heart disease Father   . Diabetes Father   . Kidney disease Father   . Osteoporosis  Sister 53  . Cancer Maternal Aunt        colon  . Osteoporosis Maternal Aunt   . Cancer Maternal Aunt        colon   . Pancreatic cancer Paternal Uncle   . Heart disease Maternal Grandfather   . Cancer Paternal Grandmother   . Pancreatic cancer Paternal Aunt   . Ovarian cancer Paternal Aunt 69    Social History   Socioeconomic History  . Marital status: Married    Spouse name: Not on file  . Number of children: Not on file  . Years of education: Not on file  . Highest education level: Not on file  Occupational History  . Not on file  Tobacco Use  . Smoking status: Never Smoker  . Smokeless tobacco: Never Used  Vaping Use  . Vaping Use: Never used  Substance and Sexual Activity  . Alcohol use: Yes    Alcohol/week: 1.0 standard drink    Types: 1 Standard drinks or equivalent per week    Comment: Social  . Drug use: No  . Sexual activity: Yes    Partners: Male    Birth control/protection: Post-menopausal    Comment: Married to [employer], works at [employer]  Other Topics Concern  . Not on file  Social History Narrative  . Not on file  Social Determinants of Health   Financial Resource Strain:   . Difficulty of Paying Living Expenses: Not on file  Food Insecurity:   . Worried About Charity fundraiser in the Last Year: Not on file  . Ran Out of Food in the Last Year: Not on file  Transportation Needs:   . Lack of Transportation (Medical): Not on file  . Lack of Transportation (Non-Medical): Not on file  Physical Activity:   . Days of Exercise per Week: Not on file  . Minutes of Exercise per Session: Not on file  Stress:   . Feeling of Stress : Not on file  Social Connections:   . Frequency of Communication with Friends and Family: Not on file  . Frequency of Social Gatherings with Friends and Family: Not on file  . Attends Religious Services: Not on file  . Active Member of Clubs or Organizations: Not on file  . Attends Archivist Meetings: Not on file  .  Marital Status: Not on file  Intimate Partner Violence:   . Fear of Current or Ex-Partner: Not on file  . Emotionally Abused: Not on file  . Physically Abused: Not on file  . Sexually Abused: Not on file    Review of Systems: See HPI, otherwise negative ROS  Physical Exam: BP 139/83   Pulse 74   Temp (!) 96.4 F (35.8 C) (Temporal)   Resp 18   Ht 5\' 2"  (1.575 m)   Wt 52.2 kg   SpO2 100%   BMI 21.03 kg/m  General:   Alert,  pleasant and cooperative in NAD Head:  Normocephalic and atraumatic. Neck:  Supple; no masses or thyromegaly. Lungs:  Clear throughout to auscultation.    Heart:  Regular rate and rhythm. Abdomen:  Soft, nontender and nondistended. Normal bowel sounds, without guarding, and without rebound.   Neurologic:  Alert and  oriented x4;  grossly normal neurologically.  Impression/Plan: Natasha Hawkins is now here to undergo a screening colonoscopy.  Risks, benefits, and alternatives regarding colonoscopy have been reviewed with the patient.  Questions have been answered.  All parties agreeable.

## 2020-08-03 NOTE — Op Note (Signed)
[facility] Gastroenterology Patient Name: Natasha Hawkins Procedure Date: 08/03/2020 7:57 AM MRN: [id number] Account #: [id number] Date of Birth: Dec 25, 1960 Admit Type: Outpatient Age: 59 Room: [facility] ENDO ROOM 4 Gender: Female Note Status: Finalized Procedure:             Colonoscopy Indications:           Screening for colorectal malignant neoplasm, Family                         history of advanced adenoma of the colon in a                         first-degree relative before age 20 years Providers:             Lucilla Lame MD, MD Referring MD:          Cletus Gash T. Pickard (Referring MD) Medicines:             Propofol per Anesthesia Complications:         No immediate complications. Procedure:             Pre-Anesthesia Assessment:                        - Prior to the procedure, a History and Physical was                         performed, and patient medications and allergies were                         reviewed. The patient's tolerance of previous                         anesthesia was also reviewed. The risks and benefits                         of the procedure and the sedation options and risks                         were discussed with the patient. All questions were                         answered, and informed consent was obtained. Prior                         Anticoagulants: The patient has taken no previous                         anticoagulant or antiplatelet agents. ASA Grade                         Assessment: II - A patient with mild systemic disease.                         After reviewing the risks and benefits, the patient                         was deemed in satisfactory condition to undergo the  procedure.                        After obtaining informed consent, the colonoscope was                         passed under direct vision. Throughout the procedure,                         the patient's blood pressure, pulse,  and oxygen                         saturations were monitored continuously. The                         Colonoscope was introduced through the anus and                         advanced to the the cecum, identified by appendiceal                         orifice and ileocecal valve. The colonoscopy was                         performed without difficulty. The patient tolerated                         the procedure well. The quality of the bowel                         preparation was excellent. Findings:      The perianal and digital rectal examinations were normal.      The colon (entire examined portion) appeared normal. Impression:            - The entire examined colon is normal.                        - No specimens collected. Recommendation:        - Discharge patient to home.                        - Resume previous diet.                        - Continue present medications.                        - Repeat colonoscopy in 5 years for surveillance. Procedure Code(s):     --- Professional ---                        [phone], Colonoscopy, flexible; diagnostic, including                         collection of specimen(s) by brushing or washing, when                         performed (separate procedure) Diagnosis Code(s):     --- Professional ---  Z12.11, Encounter for screening for malignant neoplasm                         of colon CPT copyright 2019 American Medical Association. All rights reserved. The codes documented in this report are preliminary and upon coder review may  be revised to meet current compliance requirements. Lucilla Lame MD, MD 08/03/2020 8:18:14 AM This report has been signed electronically. Number of Addenda: 0 Note Initiated On: 08/03/2020 7:57 AM Scope Withdrawal Time: 0 hours 7 minutes 26 seconds  Total Procedure Duration: 0 hours 12 minutes 9 seconds  Estimated Blood Loss:  Estimated blood loss: none.      [facility]

## 2020-08-03 NOTE — Anesthesia Preprocedure Evaluation (Signed)
Anesthesia Evaluation  Patient identified by MRN, date of birth, ID band Patient awake    Reviewed: Allergy & Precautions, H&P , NPO status , Patient's Chart, lab work & pertinent test results  History of Anesthesia Complications Negative for: history of anesthetic complications  Airway Mallampati: III  TM Distance: <3 FB Neck ROM: full    Dental  (+) Chipped   Pulmonary neg pulmonary ROS, neg shortness of breath,    Pulmonary exam normal        Cardiovascular Exercise Tolerance: Good (-) angina(-) DOE Normal cardiovascular exam+ Valvular Problems/Murmurs      Neuro/Psych negative neurological ROS  negative psych ROS   GI/Hepatic negative GI ROS, Neg liver ROS, neg GERD  ,  Endo/Other  Hypothyroidism   Renal/GU negative Renal ROS  negative genitourinary   Musculoskeletal   Abdominal   Peds  Hematology negative hematology ROS (+)   Anesthesia Other Findings Past Medical History: No date: Biliary anomaly No date: Biliary dyskinesia No date: Endometriosis No date: Heart murmur No date: Hyperlipidemia No date: Hypothyroidism No date: Internal hemorrhoids No date: Osteopenia No date: Thyroid disease     Comment:  Phreesia 05/12/2020  Past Surgical History: No date: APPENDECTOMY No date: BREAST CYST ASPIRATION; Left 05/28/2015: COLONOSCOPY WITH PROPOFOL; N/A     Comment:  Procedure: COLONOSCOPY WITH PROPOFOL;  Surgeon: Manya Silvas, MD;  Location: [facility] ENDOSCOPY;  Service:               Endoscopy;  Laterality: N/A; No date: FRACTURE SURGERY; N/A     Comment:  Phreesia 05/12/2020 No date: LAPAROSCOPIC SALPINGO OOPHERECTOMY; Left No date: OOPHORECTOMY  BMI    Body Mass Index: 21.03 kg/m      Reproductive/Obstetrics negative OB ROS                             Anesthesia Physical Anesthesia Plan  ASA: II  Anesthesia Plan: General   Post-op Pain Management:     Induction: Intravenous  PONV Risk Score and Plan: Propofol infusion and TIVA  Airway Management Planned: Natural Airway and Nasal Cannula  Additional Equipment:   Intra-op Plan:   Post-operative Plan:   Informed Consent: I have reviewed the patients History and Physical, chart, labs and discussed the procedure including the risks, benefits and alternatives for the proposed anesthesia with the patient or authorized representative who has indicated his/her understanding and acceptance.     Dental Advisory Given  Plan Discussed with: Anesthesiologist, CRNA and Surgeon  Anesthesia Plan Comments: (Patient consented for risks of anesthesia including but not limited to:  - adverse reactions to medications - risk of intubation if required - damage to eyes, teeth, lips or other oral mucosa - nerve damage due to positioning  - sore throat or hoarseness - Damage to heart, brain, nerves, lungs, other parts of body or loss of life  Patient voiced understanding.)        Anesthesia Quick Evaluation

## 2020-08-04 ENCOUNTER — Encounter: Payer: Self-pay | Admitting: Gastroenterology

## 2020-08-04 NOTE — Anesthesia Postprocedure Evaluation (Signed)
Anesthesia Post Note  Patient: [name]  Procedure(s) Performed: COLONOSCOPY WITH PROPOFOL (N/A )  Patient location during evaluation: PACU Anesthesia Type: General Level of consciousness: awake and alert Pain management: pain level controlled Vital Signs Assessment: post-procedure vital signs reviewed and stable Respiratory status: spontaneous breathing, nonlabored ventilation, respiratory function stable and patient connected to nasal cannula oxygen Cardiovascular status: blood pressure returned to baseline and stable Postop Assessment: no apparent nausea or vomiting Anesthetic complications: no   No complications documented.   Last Vitals:  Vitals:   08/03/20 0818 08/03/20 0845  BP:  116/75  Pulse:    Resp:    Temp: (!) 36 C   SpO2:      Last Pain:  Vitals:   08/03/20 0845  TempSrc:   PainSc: 0-No pain                 Molli Barrows

## 2020-08-18 ENCOUNTER — Other Ambulatory Visit: Payer: Self-pay | Admitting: Family Medicine

## 2020-08-18 DIAGNOSIS — Z1231 Encounter for screening mammogram for malignant neoplasm of breast: Secondary | ICD-10-CM

## 2020-10-07 ENCOUNTER — Ambulatory Visit
Admission: RE | Admit: 2020-10-07 | Discharge: 2020-10-07 | Disposition: A | Discharge status: Home / Self Care | Payer: Managed Care, Other (non HMO) | Source: Ambulatory Visit | Attending: Family Medicine | Admitting: Family Medicine

## 2020-10-07 ENCOUNTER — Other Ambulatory Visit: Payer: Self-pay

## 2020-10-07 DIAGNOSIS — Z1231 Encounter for screening mammogram for malignant neoplasm of breast: Secondary | ICD-10-CM | POA: Diagnosis present

## 2020-12-06 ENCOUNTER — Other Ambulatory Visit: Payer: Self-pay

## 2020-12-06 ENCOUNTER — Ambulatory Visit: Payer: Managed Care, Other (non HMO) | Admitting: Dermatology

## 2020-12-06 DIAGNOSIS — L821 Other seborrheic keratosis: Secondary | ICD-10-CM

## 2020-12-06 DIAGNOSIS — L578 Other skin changes due to chronic exposure to nonionizing radiation: Secondary | ICD-10-CM

## 2020-12-06 DIAGNOSIS — Z1283 Encounter for screening for malignant neoplasm of skin: Secondary | ICD-10-CM

## 2020-12-06 DIAGNOSIS — L738 Other specified follicular disorders: Secondary | ICD-10-CM | POA: Diagnosis not present

## 2020-12-06 DIAGNOSIS — D692 Other nonthrombocytopenic purpura: Secondary | ICD-10-CM | POA: Diagnosis not present

## 2020-12-06 DIAGNOSIS — D229 Melanocytic nevi, unspecified: Secondary | ICD-10-CM

## 2020-12-06 DIAGNOSIS — L570 Actinic keratosis: Secondary | ICD-10-CM

## 2020-12-06 DIAGNOSIS — L814 Other melanin hyperpigmentation: Secondary | ICD-10-CM

## 2020-12-06 DIAGNOSIS — D1800 Hemangioma unspecified site: Secondary | ICD-10-CM

## 2020-12-06 NOTE — Progress Notes (Signed)
   New Patient Visit  Subjective  Natasha Hawkins is a 60 y.o. female who presents for the following: Total body skin exam (Check moles all over, no hx of skin ca, no fhx of skin ca), Nail Problem (L great toenail, ~61m, pt does not think it has changed), and check spots (L arm, no symptoms, /L lower leg, no symptoms).   The following portions of the chart were reviewed this encounter and updated as appropriate:       Review of Systems:  No other skin or systemic complaints except as noted in HPI or Assessment and Plan.  Objective  Well appearing patient in no apparent distress; mood and affect are within normal limits.  A full examination was performed including scalp, head, eyes, ears, nose, lips, neck, chest, axillae, abdomen, back, buttocks, bilateral upper extremities, bilateral lower extremities, hands, feet, fingers, toes, fingernails, and toenails. All findings within normal limits unless otherwise noted below.  Objective  Left Upper Arm, L pretibia, R ant thigh, L post shoulder: Stuck-on, waxy, tan-brown papules and macules-- Discussed benign etiology and prognosis.   Objective  Right infraocular: Yellow lobulated papules  Objective  Left great toenail: 4.39mm purpuric/brown macule mid nail plate, doesn't recall trauma, no changes per pt  Objective  Nasal dorsum x 1: Pink scaly macule    Assessment & Plan   Lentigines - Scattered tan macules - Discussed due to sun exposure - Benign, observe - Recommend daily broad spectrum sunscreen SPF 30+ to sun-exposed areas, reapply every 2 hours as needed. - Call for any changes  Actinic Damage - chronic, secondary to cumulative UV radiation exposure/sun exposure over time - diffuse scaly erythematous macules with underlying dyspigmentation - Recommend daily broad spectrum sunscreen SPF 30+ to sun-exposed areas, reapply every 2 hours as needed.  - Call for new or changing lesions. - chest, shoulders  Seborrheic  keratosis Left Upper Arm, L pretibia, R ant thigh, L post shoulder  Reassured benign age-related growth.  Recommend observation.  Discussed cryotherapy if spot(s) become irritated or inflamed.     Sebaceous hyperplasia Right infraocular  Benign, observe.    Purpura ([place]) Left great toenail  Benign Appearing, observe Should grow out with time, pt will RTC if changes  AK (actinic keratosis) Nasal dorsum x 1  Destruction of lesion - Nasal dorsum x 1  Destruction method: cryotherapy   Informed consent: discussed and consent obtained   Lesion destroyed using liquid nitrogen: Yes   Region frozen until ice ball extended beyond lesion: Yes   Outcome: patient tolerated procedure well with no complications   Post-procedure details: wound care instructions given     Hemangiomas - Red papules - Discussed benign nature - Observe - Call for any changes  Skin cancer screening performed today.  Melanocytic Nevi - Tan-brown and/or pink-flesh-colored symmetric macules and papules - Benign appearing on exam today - Observation - Call clinic for new or changing moles - Recommend daily use of broad spectrum spf 30+ sunscreen to sun-exposed areas.  - L chin  Return in about 1 year (around 12/06/2021) for TBSE, Hx of AKs.  I, Othelia Pulling, RMA, am acting as scribe for Brendolyn Patty, MD . Documentation: I have reviewed the above documentation for accuracy and completeness, and I agree with the above.  Brendolyn Patty MD

## 2020-12-06 NOTE — Patient Instructions (Addendum)
Cryotherapy Aftercare  . Wash gently with soap and water everyday.   Marland Kitchen Apply Vaseline and Band-Aid daily until healed.    Actinic Keratosis  What is an actinic keratosis? An actinic keratosis (plural: actinic keratoses) is growth on the surface of the skin that usually appears as a red, hard, crusty or scaly bump.  What causes actinic keratoses? Repeated prolonged sun exposure causes skin damage, especially in fair-skinned persons. Sun-damaged skin becomes dry and wrinkled and may form rough, scaly spots called actinic keratoses. These rough spots remain on the skin even though the crust or scale on top is picked off.  Why treat actinic keratoses? Actinic keratoses are not skin cancers, but because they may sometimes turn cancerous they are called "pre-cancerous". Not all will turn to skin cancer, and it usually takes several years for this to happen. Because it is much easier to treat an actinic keratosis then it is to remove a skin cancer, actinic keratoses should be treated to prevent future skin cancer.  How are actinic keratoses treated? The most common way of treating actinic keratoses is to freeze them with liquid nitrogen. Freezing causes scabbing and shedding of the sun-damaged skin. Healing after a removal usually takes two weeks, depending on the size and location of the keratosis. Hands and legs heal more slowly than the face. The skin's final appearance is usually excellent. There are several topical medications that can be used to treat actinic keratoses. These medications generally have side effects of redness, crusting, and pain.Some are used for a few days, and some for several months before the actinic keratosis is completely gone. Photodynamic therapy is another alternative to freezing actinic keratoses.This treatment is done in a physician's office.A medication is applied to the area of skin with actinic keratoses, and it is allowed to soak in for one or more hours. A  special light is then applied to the skin.Side effects include redness, burning, and peeling.  How can you prevent actinic keratoses? Protection from the sun is the best way to prevent actinic keratoses.The use of proper clothing and sunscreens can prevent the sun damage that leads to an actinic keratosis. Unfortunately, some sun damage is permanent. Once sun damage has progressed to the point where actinic keratoses develop, new keratoses may appear even without further sun exposure. However, even in skin that is already heavily sun damaged, good sun protection can help reduce the number of actinic keratoses that will appear.    Seborrheic Keratosis  What causes seborrheic keratoses? Seborrheic keratoses are harmless, common skin growths that first appear during adult life.  As time goes by, more growths appear.  Some people may develop a large number of them.  Seborrheic keratoses appear on both covered and uncovered body parts.  They are not caused by sunlight.  The tendency to develop seborrheic keratoses can be inherited.  They vary in color from skin-colored to gray, brown, or even black.  They can be either smooth or have a rough, warty surface.   Seborrheic keratoses are superficial and look as if they were stuck on the skin.  Under the microscope this type of keratosis looks like layers upon layers of skin.  That is why at times the top layer may seem to fall off, but the rest of the growth remains and re-grows.    Treatment Seborrheic keratoses do not need to be treated, but can easily be removed in the office.  Seborrheic keratoses often cause symptoms when they rub on clothing or  jewelry.  Lesions can be in the way of shaving.  If they become inflamed, they can cause itching, soreness, or burning.  Removal of a seborrheic keratosis can be accomplished by freezing, burning, or surgery. If any spot bleeds, scabs, or grows rapidly, please return to have it checked, as these can be an  indication of a skin cancer.

## 2020-12-07 ENCOUNTER — Telehealth: Payer: Self-pay

## 2020-12-07 NOTE — Telephone Encounter (Signed)
Advised pt I forgot to take a photo of her left great toenail.  She will send a photo through mychart./sh

## 2020-12-09 ENCOUNTER — Encounter: Payer: Self-pay | Admitting: Dermatology

## 2020-12-28 NOTE — Progress Notes (Signed)
60 y.o. Natasha Hawkins Married White or Caucasian Not Hispanic or Latino female here for annual exam. No vaginal bleeding. Normal bowel and bladder function.     H/O dyspareunia, using vaginal estrogen and lidocaine which helps.       No LMP recorded. Patient is postmenopausal.          Sexually active: Yes.    The current method of family planning is post menopausal status.    Exercising: Yes.    walking  Smoker:  no  Health Maintenance: Pap: 10-16-16 WNL NEG HR HPV 10-06-14 WNL  History of abnormal Pap:  Yes years ago, no surgery on her cervix.    MMG:  10/08/20 density C Bi-rads 1 neg  BMD:    06/16/19 Osteopenic t-score -1.6, FRAX 13/1.3% Colonoscopy: 08/03/20 normal F/U 5 years  TDaP: 09/26/12     reports that she has never smoked. She has never used smokeless tobacco. She reports current alcohol use of about 1.0 standard drink of alcohol per week. She reports that she does not use drugs. Retired, having a great time. Husband is still working, has his own business. [place].   Past Medical History:  Diagnosis Date  . Biliary anomaly   . Biliary dyskinesia   . Endometriosis   . Heart murmur   . Hyperlipidemia   . Hypothyroidism   . Internal hemorrhoids   . Osteopenia   . Thyroid disease    [employer]rees[employer] 05/12/2020    Past Surgical History:  Procedure Laterality Date  . APPENDECTOMY    . BREAST CYST ASPIRATION Left   . COLONOSCOPY WITH PROPOFOL N/A 05/28/2015   Procedure: COLONOSCOPY WITH PROPOFOL;  Surgeon: Manya Silvas, MD;  Location: [facility] ENDOSCOPY;  Service: Endoscopy;  Laterality: N/A;  . COLONOSCOPY WITH PROPOFOL N/A 08/03/2020   Procedure: COLONOSCOPY WITH PROPOFOL;  Surgeon: Lucilla Lame, MD;  Location: [facility] ENDOSCOPY;  Service: Endoscopy;  Laterality: N/A;  . FRACTURE SURGERY N/A    [employer]reesia 05/12/2020  . LAPAROSCOPIC SALPINGO OOPHERECTOMY Left   . OOPHORECTOMY      Current Outpatient Medications  Medication Sig Dispense Refill  . Calcium-Vitamin  D (CALTRATE 600 PLUS-VIT D PO) Take by mouth.    . Estradiol 10 MCG TABS vaginal tablet Place one tablet vaginally 2 x a week at hs 24 tablet 3  . LEVOXYL 50 MCG tablet TAKE 1 TABLET BY MOUTH EVERY DAY 90 tablet 3  . lidocaine (XYLOCAINE) 5 % ointment Apply a pea sized amount topically 20 minutes prior to intercourse, then wipe it off just prior to intercourse 30 g 1  . pravastatin (PRAVACHOL) 20 MG tablet TAKE 1 TABLET(20 MG) BY MOUTH DAILY 90 tablet 3   No current facility-administered medications for this visit.    Family History  Problem Relation Age of Onset  . Cancer Mother        lung  . Intracerebral hemorrhage Father   . Heart disease Father   . Diabetes Father   . Kidney disease Father   . Osteoporosis Sister 55  . Cancer Maternal Aunt        colon  . Osteoporosis Maternal Aunt   . Cancer Maternal Aunt        colon   . Pancreatic cancer Paternal Uncle   . Heart disease Maternal Grandfather   . Cancer Paternal Grandmother   . Pancreatic cancer Paternal Aunt   . Ovarian cancer Paternal Aunt 79    Review of Systems  All other systems reviewed  and are negative.   Exam:   There were no vitals taken for this visit.  Weight change: @WEIGHTCHANGE @ Height:      Ht Readings from Last 3 Encounters:  08/03/20 5\' 2"  (1.575 m)  12/30/19 5' 1.5" (1.562 m)  04/29/19 5\' 2"  (1.575 m)    General appearance: alert, cooperative and appears stated age Head: Normocephalic, without obvious abnormality, atraumatic Neck: no adenopathy, supple, symmetrical, trachea midline and thyroid normal to inspection and palpation Lungs: clear to auscultation bilaterally Cardiovascular: regular rate and rhythm Breasts: normal appearance, no masses or tenderness Abdomen: soft, non-tender; non distended,  no masses,  no organomegaly Extremities: extremities normal, atraumatic, no cyanosis or edema Skin: Skin color, texture, turgor normal. No rashes or lesions Lymph nodes: Cervical,  supraclavicular, and axillary nodes normal. No abnormal inguinal nodes palpated Neurologic: Grossly normal   Pelvic: External genitalia:  no lesions              Urethra:  normal appearing urethra with no masses, tenderness or lesions              Bartholins and Skenes: normal                 Vagina: mildly atrophic appearing vagina with normal color and discharge, no lesions              Cervix: no lesions and stenotic               Bimanual Exam:  Uterus:  normal size, contour, position, consistency, mobility, non-tender              Adnexa: no mass, fullness, tenderness               Rectovaginal: Confirms               Anus:  normal sphincter tone, no lesions  Gae Dry chaperoned for the exam.  1. Well woman exam Discussed breast self exam Discussed calcium and vit D intake Mammogram and colonoscopy are UTD Labs with primary  2. Vaginal atrophy  - Estradiol 10 MCG TABS vaginal tablet; Place one tablet vaginally 2 x a week at hs  Dispense: 24 tablet; Refill: 3  3. Osteopenia, unspecified location DEXA due in August, will do with primary.   4. H/O vitamin D deficiency On vit D, will follow with primary  5. Family history of colon cancer Colonoscopy is UTD  6. Screening for cervical cancer - Cytology - PAP and HPV  7. Dyspareunia in female  - lidocaine (XYLOCAINE) 5 % ointment; Apply a pea sized amount topically 20 minutes prior to intercourse, then wipe it off just prior to intercourse  Dispense: 30 g; Refill: 1 - Estradiol 10 MCG TABS vaginal tablet; Place one tablet vaginally 2 x a week at hs  Dispense: 24 tablet; Refill: 3

## 2020-12-30 ENCOUNTER — Encounter: Payer: Self-pay | Admitting: Obstetrics and Gynecology

## 2020-12-30 ENCOUNTER — Other Ambulatory Visit: Payer: Self-pay

## 2020-12-30 ENCOUNTER — Ambulatory Visit: Payer: Managed Care, Other (non HMO) | Admitting: Obstetrics and Gynecology

## 2020-12-30 ENCOUNTER — Other Ambulatory Visit (HOSPITAL_COMMUNITY)
Admission: RE | Admit: 2020-12-30 | Discharge: 2020-12-30 | Disposition: A | Discharge status: Home / Self Care | Payer: Managed Care, Other (non HMO) | Source: Ambulatory Visit | Attending: Obstetrics and Gynecology | Admitting: Obstetrics and Gynecology

## 2020-12-30 VITALS — BP 122/64 | HR 69 | Ht 62.0 in | Wt 121.2 lb

## 2020-12-30 DIAGNOSIS — Z8639 Personal history of other endocrine, nutritional and metabolic disease: Secondary | ICD-10-CM | POA: Diagnosis not present

## 2020-12-30 DIAGNOSIS — Z800 Family history of malignant neoplasm of digestive organs: Secondary | ICD-10-CM

## 2020-12-30 DIAGNOSIS — N941 Unspecified dyspareunia: Secondary | ICD-10-CM

## 2020-12-30 DIAGNOSIS — M8580 Other specified disorders of bone density and structure, unspecified site: Secondary | ICD-10-CM | POA: Diagnosis not present

## 2020-12-30 DIAGNOSIS — Z01419 Encounter for gynecological examination (general) (routine) without abnormal findings: Secondary | ICD-10-CM | POA: Diagnosis not present

## 2020-12-30 DIAGNOSIS — N952 Postmenopausal atrophic vaginitis: Secondary | ICD-10-CM

## 2020-12-30 DIAGNOSIS — Z124 Encounter for screening for malignant neoplasm of cervix: Secondary | ICD-10-CM | POA: Diagnosis not present

## 2020-12-30 MED ORDER — LIDOCAINE 5 % EX OINT: TOPICAL_OINTMENT | CUTANEOUS | 1 refills | Status: DC

## 2020-12-30 MED ORDER — ESTRADIOL 10 MCG VA TABS: ORAL_TABLET | VAGINAL | 3 refills | Status: DC

## 2020-12-30 NOTE — Patient Instructions (Signed)
EXERCISE   We recommended that you start or continue a regular exercise program for good health. Physical activity is anything that gets your body moving, some is better than none. The CDC recommends 150 minutes per week of Moderate-Intensity Aerobic Activity and 2 or more days of Muscle Strengthening Activity.  Benefits of exercise are limitless: helps weight loss/weight maintenance, improves mood and energy, helps with depression and anxiety, improves sleep, tones and strengthens muscles, improves balance, improves bone density, protects from chronic conditions such as heart disease, high blood pressure and diabetes and so much more. To learn more visit: https://www.cdc.gov/physicalactivity/index.html  DIET: Good nutrition starts with a healthy diet of fruits, vegetables, whole grains, and lean protein sources. Drink plenty of water for hydration. Minimize empty calories, sodium, sweets. For more information about dietary recommendations visit: https://health.gov/our-work/nutrition-physical-activity/dietary-guidelines and https://www.myplate.gov/  ALCOHOL:  Women should limit their alcohol intake to no more than 7 drinks/beers/glasses of wine (combined, not each!) per week. Moderation of alcohol intake to this level decreases your risk of breast cancer and liver damage.  If you are concerned that you may have a problem, or your friends have told you they are concerned about your drinking, there are many resources to help. A well-known program that is free, effective, and available to all people all over the nation is Alcoholics Anonymous.  Check out this site to learn more: https://www.aa.org/   CALCIUM AND VITAMIN D:  Adequate intake of calcium and Vitamin D are recommended for bone health.  You should be getting between 1000-1200 mg of calcium and 800 units of Vitamin D daily between diet and supplements  PAP SMEARS:  Pap smears, to check for cervical cancer or precancers,  have traditionally been  done yearly, scientific advances have shown that most women can have pap smears less often.  However, every woman still should have a physical exam from her gynecologist every year. It will include a breast check, inspection of the vulva and vagina to check for abnormal growths or skin changes, a visual exam of the cervix, and then an exam to evaluate the size and shape of the uterus and ovaries. We will also provide age appropriate advice regarding health maintenance, like when you should have certain vaccines, screening for sexually transmitted diseases, bone density testing, colonoscopy, mammograms, etc.   MAMMOGRAMS:  All women over 40 years old should have a routine mammogram.   COLON CANCER SCREENING: Now recommend starting at age 45. At this time colonoscopy is not covered for routine screening until 50. There are take home tests that can be done between 45-49.   COLONOSCOPY:  Colonoscopy to screen for colon cancer is recommended for all women at age 50.  We know, you hate the idea of the prep.  We agree, BUT, having colon cancer and not knowing it is worse!!  Colon cancer so often starts as a polyp that can be seen and removed at colonscopy, which can quite literally save your life!  And if your first colonoscopy is normal and you have no family history of colon cancer, most women don't have to have it again for 10 years.  Once every ten years, you can do something that may end up saving your life, right?  We will be happy to help you get it scheduled when you are ready.  Be sure to check your insurance coverage so you understand how much it will cost.  It may be covered as a preventative service at no cost, but you should check   your particular policy.      Breast Self-Awareness Breast self-awareness means being familiar with how your breasts look and feel. It involves checking your breasts regularly and reporting any changes to your health care provider. Practicing breast self-awareness is  important. A change in your breasts can be a sign of a serious medical problem. Being familiar with how your breasts look and feel allows you to find any problems early, when treatment is more likely to be successful. All women should practice breast self-awareness, including women who have had breast implants. How to do a breast self-exam One way to learn what is normal for your breasts and whether your breasts are changing is to do a breast self-exam. To do a breast self-exam: Look for Changes  1. Remove all the clothing above your waist. 2. Stand in front of a mirror in a room with good lighting. 3. Put your hands on your hips. 4. Push your hands firmly downward. 5. Compare your breasts in the mirror. Look for differences between them (asymmetry), such as: ? Differences in shape. ? Differences in size. ? Puckers, dips, and bumps in one breast and not the other. 6. Look at each breast for changes in your skin, such as: ? Redness. ? Scaly areas. 7. Look for changes in your nipples, such as: ? Discharge. ? Bleeding. ? Dimpling. ? Redness. ? A change in position. Feel for Changes Carefully feel your breasts for lumps and changes. It is best to do this while lying on your back on the floor and again while sitting or standing in the shower or tub with soapy water on your skin. Feel each breast in the following way:  Place the arm on the side of the breast you are examining above your head.  Feel your breast with the other hand.  Start in the nipple area and make  inch (2 cm) overlapping circles to feel your breast. Use the pads of your three middle fingers to do this. Apply light pressure, then medium pressure, then firm pressure. The light pressure will allow you to feel the tissue closest to the skin. The medium pressure will allow you to feel the tissue that is a little deeper. The firm pressure will allow you to feel the tissue close to the ribs.  Continue the overlapping circles,  moving downward over the breast until you feel your ribs below your breast.  Move one finger-width toward the center of the body. Continue to use the  inch (2 cm) overlapping circles to feel your breast as you move slowly up toward your collarbone.  Continue the up and down exam using all three pressures until you reach your armpit.  Write Down What You Find  Write down what is normal for each breast and any changes that you find. Keep a written record with breast changes or normal findings for each breast. By writing this information down, you do not need to depend only on memory for size, tenderness, or location. Write down where you are in your menstrual cycle, if you are still menstruating. If you are having trouble noticing differences in your breasts, do not get discouraged. With time you will become more familiar with the variations in your breasts and more comfortable with the exam. How often should I examine my breasts? Examine your breasts every month. If you are breastfeeding, the best time to examine your breasts is after a feeding or after using a breast pump. If you menstruate, the best time to   examine your breasts is 5-7 days after your period is over. During your period, your breasts are lumpier, and it may be more difficult to notice changes. When should I see my health care provider? See your health care provider if you notice:  A change in shape or size of your breasts or nipples.  A change in the skin of your breast or nipples, such as a reddened or scaly area.  Unusual discharge from your nipples.  A lump or thick area that was not there before.  Pain in your breasts.  Anything that concerns you.  Atrophic Vaginitis  Atrophic vaginitis is a condition in which the tissues that line the vagina become dry and thin. This condition is most common in women who have stopped having regular menstrual periods (are in menopause). This usually starts when a woman is 45 to 61  years old. That is the time when a woman's estrogen levels begin to decrease. Estrogen is a female hormone. It helps to keep the tissues of the vagina moist. It stimulates the vagina to produce a clear fluid that lubricates the vagina for sex. This fluid also protects the vagina from infection. Lack of estrogen can cause the lining of the vagina to get thinner and dryer. The vagina may also shrink in size. It may become less elastic. Atrophic vaginitis tends to get worse over time as a woman's estrogen level drops. What are the causes? This condition is caused by the normal drop in estrogen that happens around the time of menopause. What increases the risk? Certain conditions or situations may lower a woman's estrogen level, leading to a higher risk for atrophic vaginitis. You are more likely to develop this condition if:  You are taking medicines that block estrogen.  You have had your ovaries removed.  You are being treated for cancer with radiation or medicines (chemotherapy).  You have given birth or are breastfeeding.  You are older than age 50.  You smoke. What are the signs or symptoms? Symptoms of this condition include:  Pain, soreness, a feeling of pressure, or bleeding during sex (dyspareunia).  Vaginal burning, irritation, or itching.  Pain or bleeding when a speculum is used in a vaginal exam.  Having burning pain while urinating.  Vaginal discharge. In some cases, there are no symptoms. How is this diagnosed? This condition is diagnosed based on your medical history and a physical exam. This will include a pelvic exam that checks the vaginal tissues. Though rare, you may also have other tests, including:  A urine test.  A test that checks the acid balance in your vagina (acid balance test). How is this treated? Treatment for this condition depends on how severe your symptoms are. Treatment may include:  Using an over-the-counter vaginal lubricant before  sex.  Using a long-acting vaginal moisturizer.  Using low-dose estrogen for moderate to severe symptoms that do not respond to other treatments. Options include creams, tablets, and inserts (vaginal rings). Before you use a vaginal estrogen, tell your health care provider if you have a history of: ? Breast cancer. ? Endometrial cancer. ? Blood clots. If you are not sexually active and your symptoms are very mild, you may not need treatment. Follow these instructions at home: Medicines  Take over-the-counter and prescription medicines only as told by your health care provider.  Do not use herbal or alternative medicines unless your health care provider says that you can.  Use over-the-counter creams, lubricants, or moisturizers for dryness only as   told by your health care provider. General instructions  If your atrophic vaginitis is caused by menopause, discuss all of your menopause symptoms and treatment options with your health care provider.  Do not douche.  Do not use products that can make your vagina dry. These include: ? Scented feminine sprays. ? Scented tampons. ? Scented soaps.  Vaginal sex can help to improve blood flow and elasticity of vaginal tissue. If you choose to have sex and it hurts, try using a water-soluble lubricant or moisturizer right before having sex. Contact a health care provider if:  Your discharge looks different than normal.  Your vagina has an unusual smell.  You have new symptoms.  Your symptoms do not improve with treatment.  Your symptoms get worse. Summary  Atrophic vaginitis is a condition in which the tissues that line the vagina become dry and thin. It is most common in women who have stopped having regular menstrual periods (are in menopause).  Treatment options include using vaginal lubricants and low-dose vaginal estrogen.  Contact a health care provider if your vagina has an unusual smell, or if your symptoms get worse or do not  improve after treatment. This information is not intended to replace advice given to you by your health care provider. Make sure you discuss any questions you have with your health care provider. Document Revised: 04/22/2020 Document Reviewed: 04/22/2020 Elsevier Patient Education  2021 Elsevier Inc.  

## 2021-01-03 LAB — CYTOLOGY - PAP
Adequacy: ABSENT
Comment: NEGATIVE
Diagnosis: NEGATIVE
High risk HPV: NEGATIVE

## 2021-03-09 ENCOUNTER — Telehealth: Payer: Self-pay | Admitting: Family Medicine

## 2021-03-09 DIAGNOSIS — E038 Other specified hypothyroidism: Secondary | ICD-10-CM

## 2021-03-09 DIAGNOSIS — Z1159 Encounter for screening for other viral diseases: Secondary | ICD-10-CM

## 2021-03-09 DIAGNOSIS — E7800 Pure hypercholesterolemia, unspecified: Secondary | ICD-10-CM

## 2021-03-09 DIAGNOSIS — Z136 Encounter for screening for cardiovascular disorders: Secondary | ICD-10-CM

## 2021-03-09 DIAGNOSIS — Z13220 Encounter for screening for lipoid disorders: Secondary | ICD-10-CM

## 2021-03-09 NOTE — Telephone Encounter (Signed)
Orders placed for [facility].   Call placed to patient. [place].

## 2021-03-09 NOTE — Telephone Encounter (Signed)
Patient called to schedule CPE with provider. Cpe scheduled for 7/11. Patient needs an Rx for labs to be done at [facility] beforehand. Please advise at [phone].

## 2021-03-10 ENCOUNTER — Other Ambulatory Visit: Payer: Self-pay | Admitting: *Deleted

## 2021-03-10 DIAGNOSIS — E559 Vitamin D deficiency, unspecified: Secondary | ICD-10-CM

## 2021-03-10 NOTE — Telephone Encounter (Signed)
Call placed to patient and patient made aware.   Requested to add Vit D for screening for deficiency.

## 2021-04-09 ENCOUNTER — Other Ambulatory Visit: Payer: Self-pay | Admitting: Family Medicine

## 2021-05-15 ENCOUNTER — Encounter: Payer: Self-pay | Admitting: Family Medicine

## 2021-05-16 ENCOUNTER — Encounter: Payer: Self-pay | Admitting: Family Medicine

## 2021-05-16 ENCOUNTER — Other Ambulatory Visit: Payer: Self-pay

## 2021-05-16 ENCOUNTER — Ambulatory Visit (INDEPENDENT_AMBULATORY_CARE_PROVIDER_SITE_OTHER): Payer: Managed Care, Other (non HMO) | Admitting: Family Medicine

## 2021-05-16 VITALS — BP 112/84 | HR 78 | Temp 98.4°F | Ht 62.0 in | Wt 119.6 lb

## 2021-05-16 DIAGNOSIS — Z0001 Encounter for general adult medical examination with abnormal findings: Secondary | ICD-10-CM

## 2021-05-16 DIAGNOSIS — Z0000 Encounter for general adult medical examination without abnormal findings: Secondary | ICD-10-CM

## 2021-05-16 DIAGNOSIS — E038 Other specified hypothyroidism: Secondary | ICD-10-CM | POA: Diagnosis not present

## 2021-05-16 DIAGNOSIS — E7800 Pure hypercholesterolemia, unspecified: Secondary | ICD-10-CM

## 2021-05-16 NOTE — Progress Notes (Signed)
Subjective:    Patient ID: Natasha Hawkins, female    DOB: 02/21/1961, 60 y.o.   MRN: [id number]  HPI Patient is a 60 year old Caucasian female here today for complete physical exam.  DEXA 2020 revealed T score of -1.6 showing osteopenia.  Last mammogram was 8/20 and is coming due.  Last colonoscopy was 2021 and Dr. Vira Agar recommended repeat colonoscopy in 5 years.  Patient brings in lab work today.  CBC is completely normal.  CMP is normal.  LDL cholesterol is less than 100.  HDL cholesterol is greater than 70.  Therefore I do not feel that she requires statin therapy.  TSH is within normal range.  The remainder of her lab work is excellent.  Mammogram has already been scheduled.  Pap smear was performed at her gynecologist.  Immunizations are up-to-date except for her COVID-vaccine which is due for a booster and also her shingles shot  Past Medical History:  Diagnosis Date   Biliary anomaly    Biliary dyskinesia    Endometriosis    Heart murmur    Hyperlipidemia    Hypothyroidism    Internal hemorrhoids    Osteopenia    Thyroid disease    Phreesia 05/12/2020   Past Surgical History:  Procedure Laterality Date   APPENDECTOMY     BREAST CYST ASPIRATION Left    COLONOSCOPY WITH PROPOFOL N/A 05/28/2015   Procedure: COLONOSCOPY WITH PROPOFOL;  Surgeon: Manya Silvas, MD;  Location: [facility];  Service: Endoscopy;  Laterality: N/A;   COLONOSCOPY WITH PROPOFOL N/A 08/03/2020   Procedure: COLONOSCOPY WITH PROPOFOL;  Surgeon: Lucilla Lame, MD;  Location: [facility] ENDOSCOPY;  Service: Endoscopy;  Laterality: N/A;   FRACTURE SURGERY N/A    Phreesia 05/12/2020   LAPAROSCOPIC SALPINGO OOPHERECTOMY Left    OOPHORECTOMY     Current Outpatient Medications on File Prior to Visit  Medication Sig Dispense Refill   Calcium-Vitamin D (CALTRATE 600 PLUS-VIT D PO) Take by mouth.     Estradiol 10 MCG TABS vaginal tablet Place one tablet vaginally 2 x a week at hs 24 tablet 3   LEVOXYL 50 MCG tablet  TAKE 1 TABLET BY MOUTH EVERY DAY 90 tablet 3   lidocaine (XYLOCAINE) 5 % ointment Apply a pea sized amount topically 20 minutes prior to intercourse, then wipe it off just prior to intercourse 30 g 1   pravastatin (PRAVACHOL) 20 MG tablet TAKE 1 TABLET(20 MG) BY MOUTH DAILY 90 tablet 3   No current facility-administered medications on file prior to visit.   No Known Allergies Social History   Socioeconomic History   Marital status: Married    Spouse name: Not on file   Number of children: Not on file   Years of education: Not on file   Highest education level: Not on file  Occupational History   Not on file  Tobacco Use   Smoking status: Never   Smokeless tobacco: Never  Vaping Use   Vaping Use: Never used  Substance and Sexual Activity   Alcohol use: Yes    Alcohol/week: 1.0 standard drink    Types: 1 Standard drinks or equivalent per week    Comment: Social   Drug use: No   Sexual activity: Yes    Partners: Male    Birth control/protection: Post-menopausal    Comment: Married to [employer], works at [employer]  Other Topics Concern   Not on file  Social History Narrative   Not on file   Social Determinants of Health  Financial Resource Strain: Not on file  Food Insecurity: Not on file  Transportation Needs: Not on file  Physical Activity: Not on file  Stress: Not on file  Social Connections: Not on file  Intimate Partner Violence: Not on file   Family History  Problem Relation Age of Onset   Cancer Mother        lung   Intracerebral hemorrhage Father    Heart disease Father    Diabetes Father    Kidney disease Father    Osteoporosis Sister 2   Cancer Maternal Aunt        colon   Osteoporosis Maternal Aunt    Cancer Maternal Aunt        colon    Pancreatic cancer Paternal Uncle    Heart disease Maternal Grandfather    Cancer Paternal Grandmother    Pancreatic cancer Paternal Aunt    Ovarian cancer Paternal Aunt 70      Review of Systems     Objective:    Physical Exam Vitals reviewed.  Constitutional:      General: She is not in acute distress.    Appearance: Normal appearance. She is normal weight. She is not ill-appearing, toxic-appearing or diaphoretic.  HENT:     Head: Normocephalic and atraumatic.     Right Ear: Tympanic membrane, ear canal and external ear normal. There is no impacted cerumen.     Left Ear: Tympanic membrane, ear canal and external ear normal. There is no impacted cerumen.     Nose: Nose normal. No congestion.     Mouth/Throat:     Mouth: Mucous membranes are moist.     Pharynx: Oropharynx is clear. No oropharyngeal exudate or posterior oropharyngeal erythema.  Eyes:     General:        Right eye: No discharge.        Left eye: No discharge.     Extraocular Movements: Extraocular movements intact.     Conjunctiva/sclera: Conjunctivae normal.     Pupils: Pupils are equal, round, and reactive to light.  Neck:     Vascular: No carotid bruit.  Cardiovascular:     Rate and Rhythm: Normal rate and regular rhythm.     Pulses: Normal pulses.     Heart sounds: Normal heart sounds. No murmur heard.   No friction rub. No gallop.  Pulmonary:     Effort: Pulmonary effort is normal. No respiratory distress.     Breath sounds: Normal breath sounds. No stridor. No wheezing, rhonchi or rales.  Abdominal:     General: Abdomen is flat. Bowel sounds are normal. There is no distension.     Palpations: Abdomen is soft. There is no mass.     Tenderness: There is no abdominal tenderness. There is no guarding.     Hernia: No hernia is present.  Musculoskeletal:        General: No swelling or deformity. Normal range of motion.     Cervical back: Normal range of motion and neck supple. No rigidity.     Right lower leg: No edema.     Left lower leg: No edema.  Lymphadenopathy:     Cervical: No cervical adenopathy.  Skin:    General: Skin is warm.     Coloration: Skin is not jaundiced.     Findings: No bruising, erythema,  lesion or rash.  Neurological:     General: No focal deficit present.     Mental Status: She is alert and oriented  to person, place, and time. Mental status is at baseline.     Cranial Nerves: No cranial nerve deficit.     Sensory: No sensory deficit.     Motor: No weakness.     Coordination: Coordination normal.     Gait: Gait normal.     Deep Tendon Reflexes: Reflexes normal.       Assessment & Plan:  Routine general medical examination at a health care facility  Other specified hypothyroidism  Pure hypercholesterolemia Labs are outstanding.  I feel that she can stop her cholesterol and recheck in 1 year.  Recommended 1000 units a day of vitamin D and 1200 mg a day of calcium.  Mammogram is already been scheduled.  Bone density is due anytime in the next year.  I will schedule this whenever she desires.  Pap smear is up-to-date.  Colonoscopy is up-to-date.  The remainder of her lab work is outstanding.  Recommended the shingles vaccine.  Recommended a COVID shot.

## 2021-05-17 NOTE — Telephone Encounter (Signed)
Noted  

## 2021-09-24 IMAGING — MG DIGITAL SCREENING BILAT W/ TOMO W/ CAD
8 series · 9 of 24 positions shown · non-contrast
Comparison: Previous exam(s).

CLINICAL DATA: Screening.

EXAM:
DIGITAL SCREENING BILATERAL MAMMOGRAM WITH TOMO AND CAD

[R MLO synth-2D]
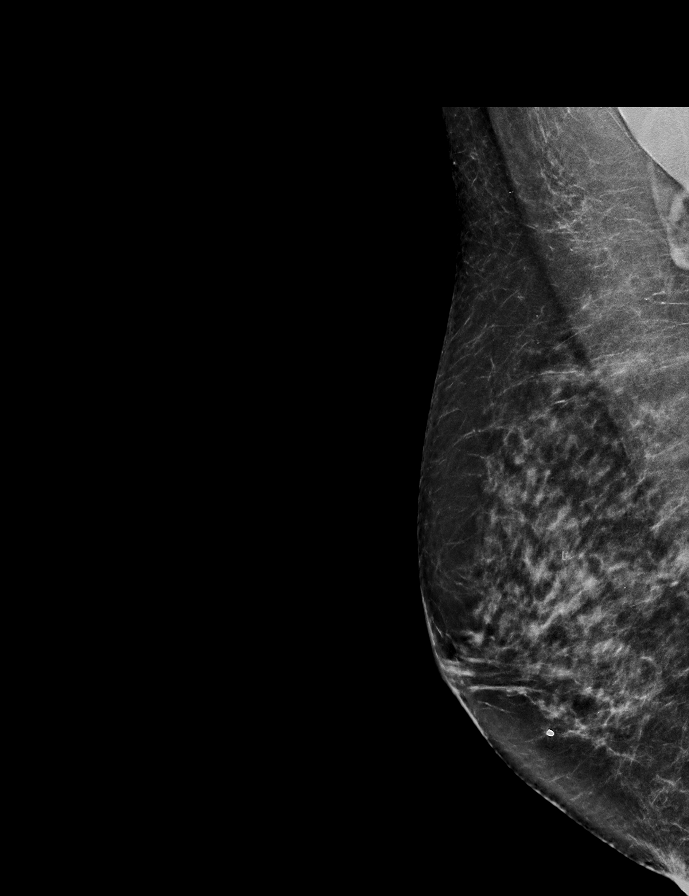

[L CC synth-2D]
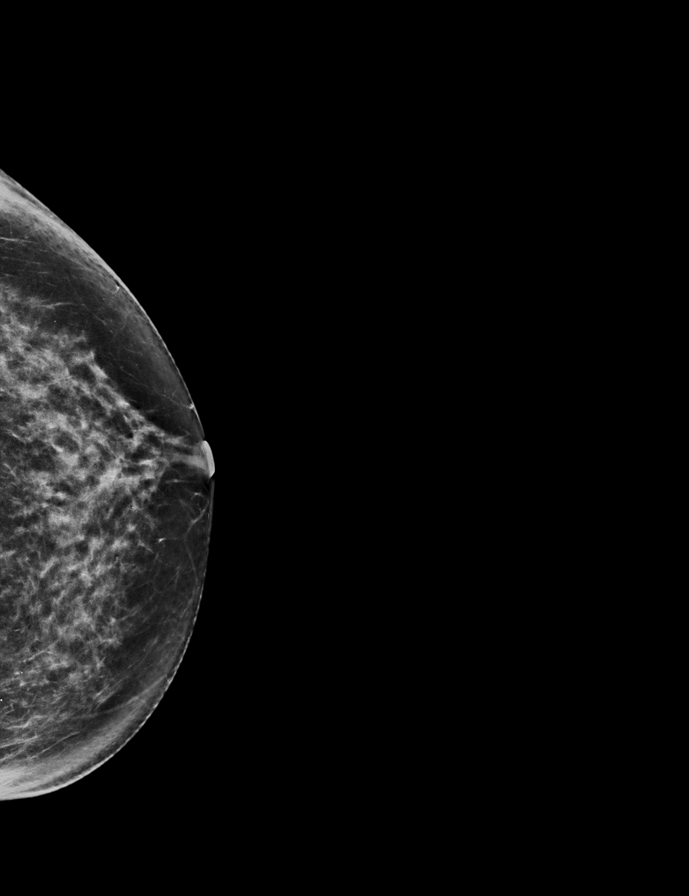

[R CC synth-2D]
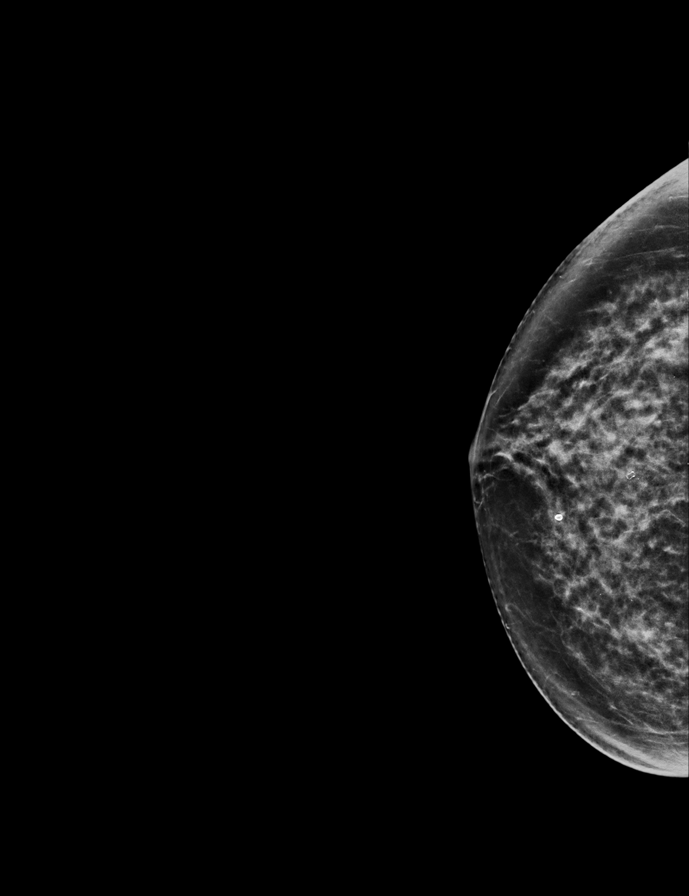

[L MLO synth-2D]
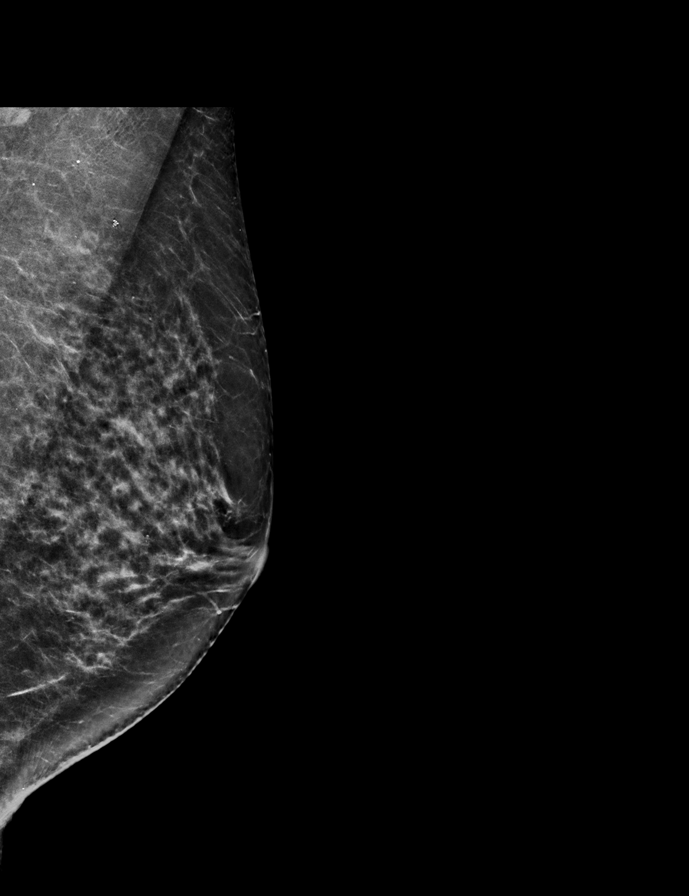

[L MLO tomo · 2 of 62 frames shown]
[frame 21/62]
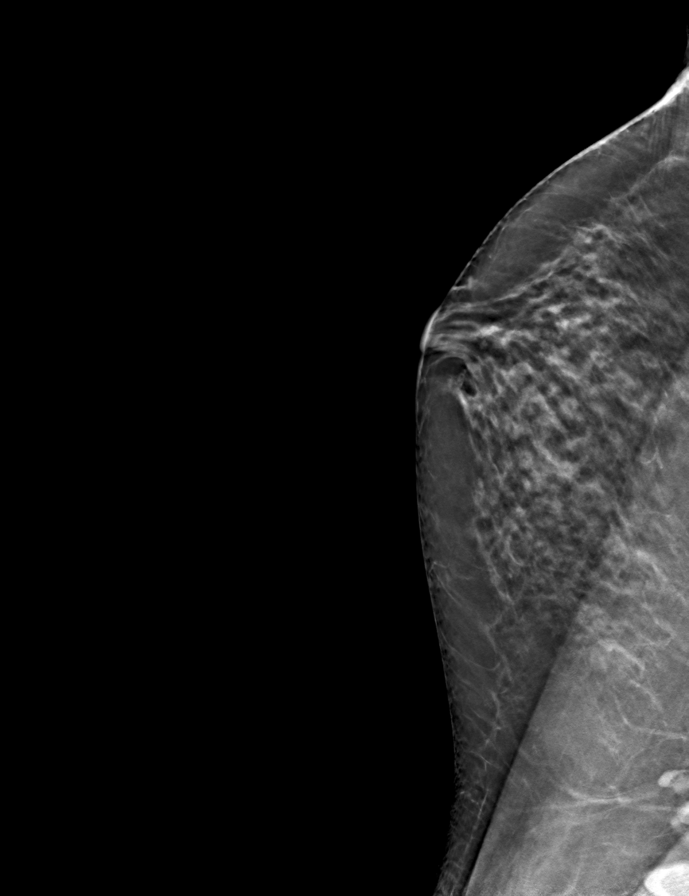
[frame 31/62]
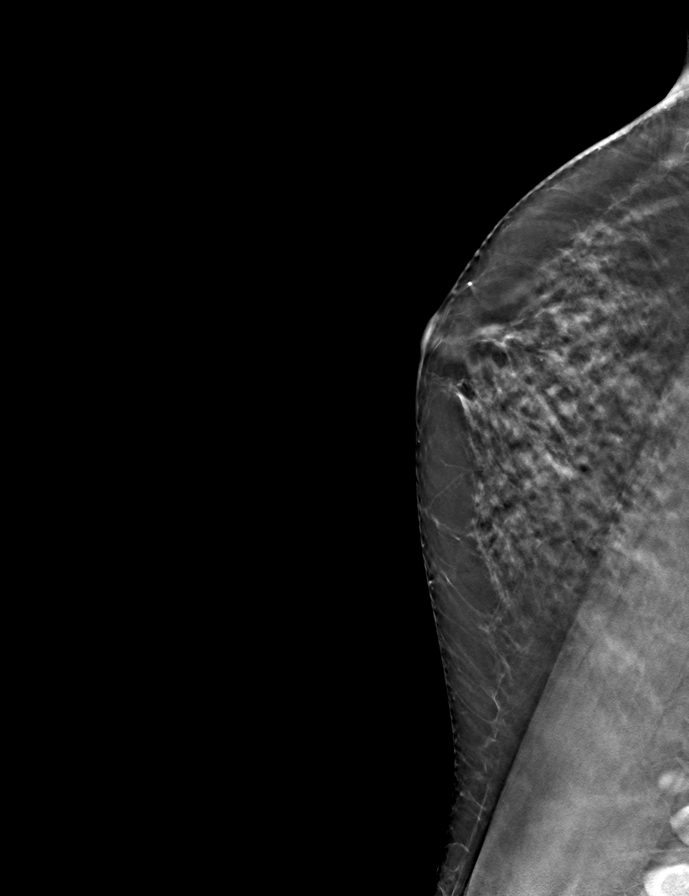

[R CC tomo · tomo slice 33/65.0]
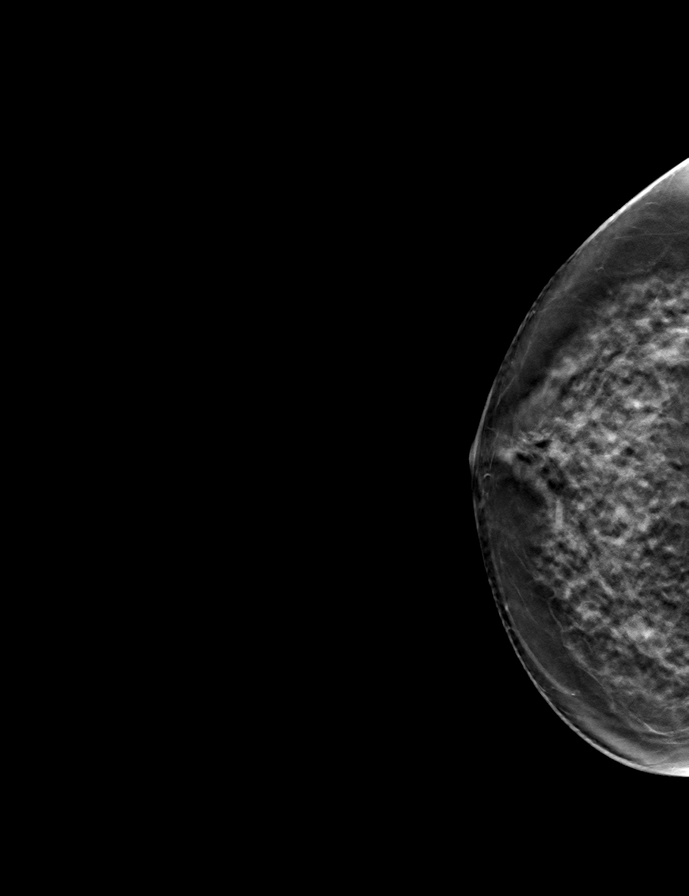

[L CC tomo · tomo slice 33/65.0]
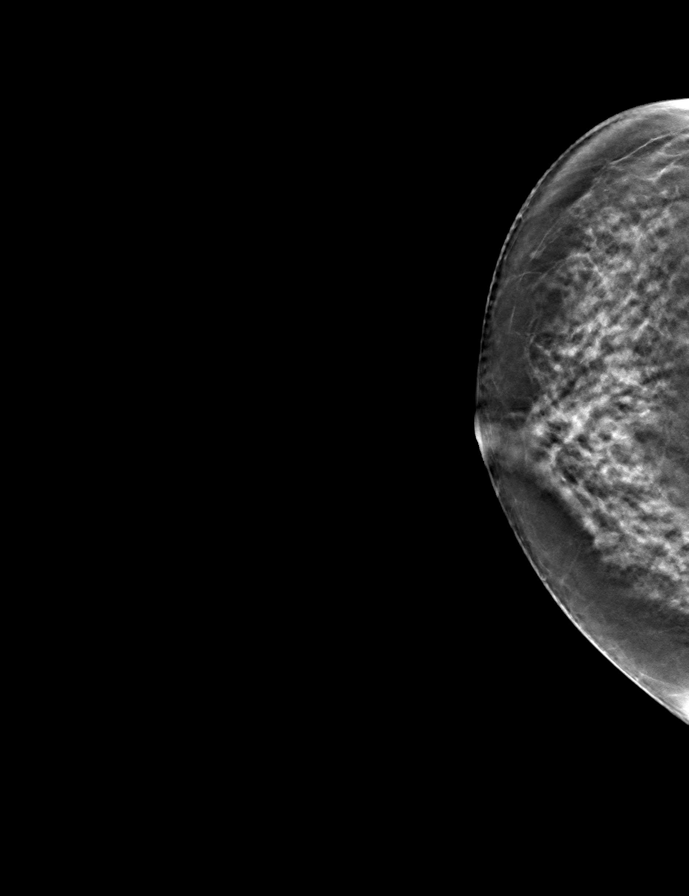

[R MLO tomo · tomo slice 35/70.0]
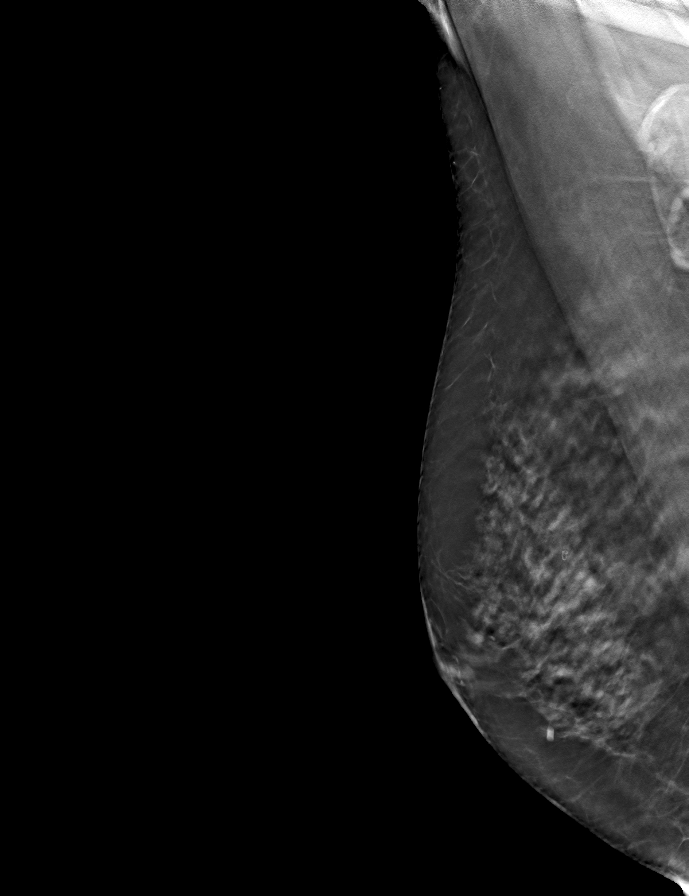

[9 of 24 positions shown; findings below may reference images not displayed]

ACR Breast Density Category c: The breast tissue is heterogeneously
dense, which may obscure small masses.
FINDINGS: There are no findings suspicious for malignancy. Images were
processed with CAD.
IMPRESSION: No mammographic evidence of malignancy. A result letter of this
screening mammogram will be mailed directly to the patient.

RECOMMENDATION:
Screening mammogram in one year. (Code:FT-U-LHB)

BI-RADS CATEGORY  1: Negative.

## 2021-11-14 ENCOUNTER — Other Ambulatory Visit: Payer: Self-pay | Admitting: Family Medicine

## 2021-11-14 DIAGNOSIS — Z1231 Encounter for screening mammogram for malignant neoplasm of breast: Secondary | ICD-10-CM

## 2021-12-12 ENCOUNTER — Ambulatory Visit: Payer: 59 | Admitting: Dermatology

## 2021-12-12 ENCOUNTER — Other Ambulatory Visit: Payer: Self-pay

## 2021-12-12 DIAGNOSIS — L853 Xerosis cutis: Secondary | ICD-10-CM | POA: Diagnosis not present

## 2021-12-12 DIAGNOSIS — L570 Actinic keratosis: Secondary | ICD-10-CM

## 2021-12-12 DIAGNOSIS — L821 Other seborrheic keratosis: Secondary | ICD-10-CM

## 2021-12-12 DIAGNOSIS — D229 Melanocytic nevi, unspecified: Secondary | ICD-10-CM

## 2021-12-12 DIAGNOSIS — Z1283 Encounter for screening for malignant neoplasm of skin: Secondary | ICD-10-CM | POA: Diagnosis not present

## 2021-12-12 DIAGNOSIS — B351 Tinea unguium: Secondary | ICD-10-CM

## 2021-12-12 DIAGNOSIS — D1800 Hemangioma unspecified site: Secondary | ICD-10-CM | POA: Diagnosis not present

## 2021-12-12 DIAGNOSIS — L814 Other melanin hyperpigmentation: Secondary | ICD-10-CM | POA: Diagnosis not present

## 2021-12-12 DIAGNOSIS — L719 Rosacea, unspecified: Secondary | ICD-10-CM

## 2021-12-12 DIAGNOSIS — L578 Other skin changes due to chronic exposure to nonionizing radiation: Secondary | ICD-10-CM | POA: Diagnosis not present

## 2021-12-12 MED ORDER — TAVABOROLE 5 % EX SOLN: CUTANEOUS | 1 refills | Status: DC

## 2021-12-12 MED ORDER — METRONIDAZOLE 0.75 % EX CREA: TOPICAL_CREAM | CUTANEOUS | 3 refills | Status: DC

## 2021-12-12 NOTE — Progress Notes (Signed)
Follow-Up Visit   Subjective  Natasha Hawkins is a 61 y.o. female who presents for the following: Annual Exam.  The patient presents for Total-Body Skin Exam (TBSE) for skin cancer screening and mole check.  The patient has spots, moles and lesions to be evaluated, some may be new or changing. She has a few spots on her left thigh she would like checked today, not bothersome. History of AK of the nasal dorsum.  She also gets acne like bumps on her nose off and on   The following portions of the chart were reviewed this encounter and updated as appropriate:       Review of Systems:  No other skin or systemic complaints except as noted in HPI or Assessment and Plan.  Objective  Well appearing patient in no apparent distress; mood and affect are within normal limits.  A full examination was performed including scalp, head, eyes, ears, nose, lips, neck, chest, axillae, abdomen, back, buttocks, bilateral upper extremities, bilateral lower extremities, hands, feet, fingers, toes, fingernails, and toenails. All findings within normal limits unless otherwise noted below.  R sternal notch x 1, L sternal notch x 1 (2) Pink scaly flat papule of the right sternal notch; pink brown scaly macule of the left sternal notch  Left Dorsal Hand 0.7 cm waxy gray brown speckled macule  face Erythema of the nose and cheeks; resolving inflammatory papule of the nose  left great toenail Distal yellow brown discoloration with onycholysis  Purpuric macule on previous pt photo has resolved       Assessment & Plan  Skin cancer screening performed today.  Actinic Damage - chronic, secondary to cumulative UV radiation exposure/sun exposure over time - diffuse scaly erythematous macules with underlying dyspigmentation - Recommend daily broad spectrum sunscreen SPF 30+ to sun-exposed areas, reapply every 2 hours as needed.  - Recommend staying in the shade or wearing long sleeves, sun glasses (UVA+UVB  protection) and wide brim hats (4-inch brim around the entire circumference of the hat). - Call for new or changing lesions.  Hemangiomas - Red papules - Discussed benign nature - Observe - Call for any changes  Lentigines - Scattered tan macules - Due to sun exposure - Benign-appering, observe - Recommend daily broad spectrum sunscreen SPF 30+ to sun-exposed areas, reapply every 2 hours as needed. - Call for any changes  Seborrheic Keratoses - Stuck-on, waxy, tan-brown papules, including left anterior thigh, left upper arm - Benign-appearing - Discussed benign etiology and prognosis. - Observe - Call for any changes - May try [place] to help smooth. Sample given.   Melanocytic Nevi - Tan-brown and/or pink-flesh-colored symmetric macules and papules - Benign appearing on exam today - Observation - Call clinic for new or changing moles - Recommend daily use of broad spectrum spf 30+ sunscreen to sun-exposed areas.   Xerosis - diffuse xerotic patches - recommend gentle, hydrating skin care - gentle skin care handout given  AK (actinic keratosis) (2) R sternal notch x 1, L sternal notch x 1  vs ISKs  Actinic keratoses are precancerous spots that appear secondary to cumulative UV radiation exposure/sun exposure over time. They are chronic with expected duration over 1 year. A portion of actinic keratoses will progress to squamous cell carcinoma of the skin. It is not possible to reliably predict which spots will progress to skin cancer and so treatment is recommended to prevent development of skin cancer.  Recommend daily broad spectrum sunscreen SPF 30+ to  sun-exposed areas, reapply every 2 hours as needed.  Recommend staying in the shade or wearing long sleeves, sun glasses (UVA+UVB protection) and wide brim hats (4-inch brim around the entire circumference of the hat). Call for new or changing lesions.  Destruction of lesion - R sternal notch x 1, L  sternal notch x 1  Destruction method: cryotherapy   Informed consent: discussed and consent obtained   Lesion destroyed using liquid nitrogen: Yes   Region frozen until ice ball extended beyond lesion: Yes   Outcome: patient tolerated procedure well with no complications   Post-procedure details: wound care instructions given   Additional details:  Prior to procedure, discussed risks of blister formation, small wound, skin dyspigmentation, or rare scar following cryotherapy. Recommend Vaseline ointment to treated areas while healing.   Seborrheic keratosis Left Dorsal Hand  Reassured benign age-related growth.  Recommend observation.  Discussed cryotherapy if spot(s) become irritated or inflamed.  Rosacea face  With mild flare  Start metronidazole 0.75% cream Apply to face QD/BID for rosacea dsp 45g 3Rf.  Rosacea is a chronic progressive skin condition usually affecting the face of adults, causing redness and/or acne bumps. It is treatable but not curable. It sometimes affects the eyes (ocular rosacea) as well. It may respond to topical and/or systemic medication and can flare with stress, sun exposure, alcohol, exercise and some foods.  Daily application of broad spectrum spf 30+ sunscreen to face is recommended to reduce flares.  metroNIDAZOLE (METROCREAM) 0.75 % cream - face Apply to cheeks and nose every night for rosacea, twice a day if flared.  Tinea unguium left great toenail  Start Kerydin solution Apply to toenail every night until improved dsp 97mL 1Rf.    Tavaborole (KERYDIN) 5 % SOLN - left great toenail Apply to toenail every night until improved.   Return in about 1 year (around 12/12/2022) for TBSE, Hx AKs.  IJamesetta Orleans, CMA, am acting as scribe for Brendolyn Patty, MD . Documentation: I have reviewed the above documentation for accuracy and completeness, and I agree with the above.  Brendolyn Patty MD

## 2021-12-12 NOTE — Patient Instructions (Addendum)
Cryotherapy Aftercare  Wash gently with soap and water everyday.   Apply Vaseline and Band-Aid daily until healed.   Rosacea  What is rosacea? Rosacea (say: ro-zay-sha) is a common skin disease that usually begins as a trend of flushing or blushing easily.  As rosacea progresses, a persistent redness in the center of the face will develop and may gradually spread beyond the nose and cheeks to the forehead and chin.  In some cases, the ears, chest, and back could be affected.  Rosacea may appear as tiny blood vessels or small red bumps that occur in crops.  Frequently they can contain pus, and are called pustules.  If the bumps do not contain pus, they are referred to as papules.  Rarely, in prolonged, untreated cases of rosacea, the oil glands of the nose and cheeks may become permanently enlarged.  This is called rhinophyma, and is seen more frequently in men.  Signs and Risks In its beginning stages, rosacea tends to come and go, which makes it difficult to recognize.  It can start as intermittent flushing of the face.  Eventually, blood vessels may become permanently visible.  Pustules and papules can appear, but can be mistaken for adult acne.  People of all races, ages, genders and ethnic groups are at risk of developing rosacea.  However, it is more common in women (especially around menopause) and adults with fair skin between the ages of 35 and 63.  Treatment Dermatologists typically recommend a combination of treatments to effectively manage rosacea.  Treatment can improve symptoms and may stop the progression of the rosacea.  Treatment may involve both topical and oral medications.  The tetracycline antibiotics are often used for their anti-inflammatory effect; however, because of the possibility of developing antibiotic resistance, they should not be used long term at full dose.  For dilated blood vessels the options include electrodessication (uses electric current through a small  needle), laser treatment, and cosmetics to hide the redness.   With all forms of treatment, improvement is a slow process, and patients may not see any results for the first 3-4 weeks.  It is very important to avoid the sun and other triggers.  Patients must wear sunscreen daily.  Skin Care Instructions: Cleanse the skin with a mild soap such as CeraVe cleanser, Cetaphil cleanser, or Dove soap once or twice daily as needed. Moisturize with Eucerin Redness Relief Daily Perfecting Lotion (has a subtle green tint), CeraVe Moisturizing Cream, or Oil of Olay Daily Moisturizer with sunscreen every morning and/or night as recommended. Makeup should be non-comedogenic (wont clog pores) and be labeled for sensitive skin. Good choices for cosmetics are: Neutrogena, Almay, and Physicians Formula.  Any product with a green tint tends to offset a red complexion. If your eyes are dry and irritated, use artificial tears 2-3 times per day and cleanse the eyelids daily with baby shampoo.  Have your eyes examined at least every 2 years.  Be sure to tell your eye doctor that you have rosacea. Alcoholic beverages tend to cause flushing of the skin, and may make rosacea worse. Always wear sunscreen, protect your skin from extreme hot and cold temperatures, and avoid spicy foods, hot drinks, and mechanical irritation such as rubbing, scrubbing, or massaging the face.  Avoid harsh skin cleansers, cleansing masks, astringents, and exfoliation. If a particular product burns or makes your face feel tight, then it is likely to flare your rosacea. If you are having difficulty finding a sunscreen that you can tolerate,  you may try switching to a chemical-free sunscreen.  These are ones whose active ingredient is zinc oxide or titanium dioxide only.  They should also be fragrance free, non-comedogenic, and labeled for sensitive skin. Rosacea triggers may vary from person to person.  There are a variety of foods that have been  reported to trigger rosacea.  Some patients find that keeping a diary of what they were doing when they flared helps them avoid triggers.   Dry Skin Care  What causes dry skin?  Dry skin is common and results from inadequate moisture in the outer skin layers. Dry skin usually results from the excessive loss of moisture from the skin surface. This occurs due to two major factors: Normally the skin's oil glands deposit a layer of oil on the skin's surface. This layer of oil prevents the loss of moisture from the skin. Exposure to soaps, cleaners, solvents, and disinfectants removes this oily film, allowing water to escape. Water loss from the skin increases when the humidity is low. During winter months we spend a lot of time indoors where the air is heated. Heated air has very low humidity. This also contributes to dry skin.  A tendency for dry skin may accompany such disorders as eczema. Also, as people age, the number of functioning oil glands decreases, and the tendency toward dry skin can be a sensation of skin tightness when emerging from the shower.  How do I manage dry skin?  Humidify your environment. This can be accomplished by using a humidifier in your bedroom at night during winter months. Bathing can actually put moisture back into your skin if done right. Take the following steps while bathing to sooth dry skin: Avoid hot water, which only dries the skin and makes itching worse. Use warm water. Avoid washcloths or extensive rubbing or scrubbing. Use mild soaps like unscented Dove, Oil of Olay, Cetaphil, Basis, or CeraVe. If you take baths rather than showers, rinse off soap residue with clean water before getting out of tub. Once out of the shower/tub, pat dry gently with a soft towel. Leave your skin damp. While still damp, apply any medicated ointment/cream you were prescribed to the affected areas. After you apply your medicated ointment/cream, then apply your moisturizer to  your whole body.This is the most important step in dry skin care. If this is omitted, your skin will continue to be dry. The choice of moisturizer is also very important. In general, lotion will not provider enough moisture to severely dry skin because it is water based. You should use an ointment or cream. Moisturizers should also be unscented. Good choices include Vaseline (plain petrolatum), Aquaphor, Cetaphil, CeraVe, Vanicream, DML Forte, Aveeno moisture, or Eucerin Cream. Bath oils can be helpful, but do not replace the application of moisturizer after the bath. In addition, they make the tub slippery causing an increased risk for falls. Therefore, we do not recommend their use.  If You Need Anything After Your Visit  If you have any questions or concerns for your doctor, please call our main line at [phone] and press option 4 to reach your doctor's medical assistant. If no one answers, please leave a voicemail as directed and we will return your call as soon as possible. Messages left after 4 pm will be answered the following business day.   You may also send [place] a message via [place]. We typically respond to MyChart messages within 1-2 business days.  For prescription refills, please ask your pharmacy to  contact our office. Our fax number is [phone].  If you have an urgent issue when the clinic is closed that cannot wait until the next business day, you can page your doctor at the number below.    Please note that while we do our best to be available for urgent issues outside of office hours, we are not available 24/7.   If you have an urgent issue and are unable to reach [place], you may choose to seek medical care at your doctor's office, retail clinic, urgent care center, or emergency room.  If you have a medical emergency, please immediately call 911 or go to the emergency department.  Pager Numbers  - Dr. Nehemiah Massed: [phone]  - Dr. Laurence Ferrari: [phone]  - Dr. Nicole Kindred:  [phone]  In the event of inclement weather, please call our main line at [phone] for an update on the status of any delays or closures.  Dermatology Medication Tips: Please keep the boxes that topical medications come in in order to help keep track of the instructions about where and how to use these. Pharmacies typically print the medication instructions only on the boxes and not directly on the medication tubes.   If your medication is too expensive, please contact our office at [phone] option 4 or send [place] a message through [place].   We are unable to tell what your co-pay for medications will be in advance as this is different depending on your insurance coverage. However, we may be able to find a substitute medication at lower cost or fill out paperwork to get insurance to cover a needed medication.   If a prior authorization is required to get your medication covered by your insurance company, please allow [place] 1-2 business days to complete this process.  Drug prices often vary depending on where the prescription is filled and some pharmacies may offer cheaper prices.  The website www.goodrx.com contains coupons for medications through different pharmacies. The prices here do not account for what the cost may be with help from insurance (it may be cheaper with your insurance), but the website can give you the price if you did not use any insurance.  - You can print the associated coupon and take it with your prescription to the pharmacy.  - You may also stop by our office during regular business hours and pick up a GoodRx coupon card.  - If you need your prescription sent electronically to a different pharmacy, notify our office through [facility] or by phone at [phone] option 4.     Si Usted Necesita Algo Despus de Su Visita  Tambin puede enviarnos un mensaje a travs de Pharmacist, community. Por lo general respondemos a los mensajes de MyChart en el transcurso de 1 a 2  das hbiles.  Para renovar recetas, por favor pida a su farmacia que se ponga en contacto con nuestra oficina. Harland Dingwall de fax es [place] [phone].  Si tiene un asunto urgente cuando la clnica est cerrada y que no puede esperar hasta el siguiente da hbil, puede llamar/localizar a su doctor(a) al nmero que aparece a continuacin.   Por favor, tenga en cuenta que aunque hacemos todo lo posible para estar disponibles para asuntos urgentes fuera del horario de [place], no estamos disponibles las 24 horas del da, los 7 das de la [place].   Si tiene un problema urgente y no puede comunicarse con nosotros, puede optar por buscar atencin mdica  en el consultorio de su doctor(a), en una clnica  privada, en un centro de atencin urgente o en una sala de emergencias.  Si tiene Engineering geologist, por favor llame inmediatamente al 911 o vaya a la sala de emergencias.  Nmeros de bper  - Dr. Nehemiah Massed: [phone]  - Dra. Moye: [phone]  - Dra. Nicole Kindred: [phone]  En caso de inclemencias del [place], por favor llame a Johnsie Kindred principal al [phone] para una actualizacin sobre el [place] de cualquier retraso o cierre.  Consejos para la medicacin en dermatologa: Por favor, guarde las cajas en las que vienen los medicamentos de uso tpico para ayudarle a seguir las instrucciones sobre dnde y cmo usarlos. Las farmacias generalmente imprimen las instrucciones del medicamento slo en las cajas y no directamente en los tubos del [place].   Si su medicamento es muy caro, por favor, pngase en contacto con Zigmund Daniel llamando al [phone] y presione la opcin 4 o envenos un mensaje a travs de Pharmacist, community.   No podemos decirle cul ser su copago por los medicamentos por adelantado ya que esto es diferente dependiendo de la cobertura de su seguro. Sin embargo, es posible que podamos encontrar un medicamento sustituto a Electrical engineer un formulario para que el  seguro cubra el medicamento que se considera necesario.   Si se requiere una autorizacin previa para que su compaa de seguros [place] su medicamento, por favor permtanos de 1 a 2 das hbiles para completar este proceso.  Los precios de los medicamentos varan con frecuencia dependiendo del Environmental consultant de dnde se surte la receta y alguna farmacias pueden ofrecer precios ms baratos.  El sitio web www.goodrx.com tiene cupones para medicamentos de Airline pilot. Los precios aqu no tienen en cuenta lo que podra costar con la ayuda del seguro (puede ser ms barato con su seguro), pero el sitio web puede darle el precio si no utiliz Research scientist (physical sciences).  - Puede imprimir el cupn correspondiente y llevarlo con su receta a la farmacia.  - Tambin puede pasar por nuestra oficina durante el horario de atencin regular y Charity fundraiser una tarjeta de cupones de GoodRx.  - Si necesita que su receta se enve electrnicamente a una farmacia diferente, informe a nuestra oficina a travs de MyChart de [place] o por telfono llamando al [phone] y presione la opcin 4.

## 2021-12-22 ENCOUNTER — Other Ambulatory Visit: Payer: Self-pay

## 2021-12-22 ENCOUNTER — Ambulatory Visit
Admission: RE | Admit: 2021-12-22 | Discharge: 2021-12-22 | Disposition: A | Discharge status: Home / Self Care | Payer: 59 | Source: Ambulatory Visit | Attending: Family Medicine | Admitting: Family Medicine

## 2021-12-22 DIAGNOSIS — Z1231 Encounter for screening mammogram for malignant neoplasm of breast: Secondary | ICD-10-CM | POA: Insufficient documentation

## 2021-12-27 NOTE — Progress Notes (Signed)
61 y.o. G0P0000 Married White or Caucasian Not Hispanic or Latino female here for annual exam.   ? ?She stopped taking the vaginal estradiol tab in October. She had a one time episode of spotting the day after inserting the tablet so she stopped using it.  ?She is sexually active, no pain if she uses the lidocaine and lubricant.  ? ?They are going on a 2 week trip to [place] this month. ?  ? ?No LMP recorded. Patient is postmenopausal.          ?Sexually active: Yes.    ?The current method of family planning is post menopausal status.    ?Exercising: Yes.     Walking  ?Smoker:  no ? ?Health Maintenance: ?Pap:  12/30/20 WNL Hr HPV Neg. 10-16-16 WNL NEG HR HPV  ?History of abnormal Pap:  Yes years ago, no surgery on her cervix.    ?MMG:  12/22/21 density D Bi-rads 1 neg  ?BMD: 06/16/19 Osteopenic t-score -1.6, FRAX 13/1.3%. Scheduled. ?Colonoscopy: 08/03/20 normal F/U 5 years  ?TDaP: 09/26/12  ?Gardasil: n/a ? ? reports that she has never smoked. She has never used smokeless tobacco. She reports current alcohol use of about 1.0 standard drink per week. She reports that she does not use drugs. Retired. Husband is still working.  ? ?Past Medical History:  ?Diagnosis Date  ? Biliary anomaly   ? Biliary dyskinesia   ? Endometriosis   ? Heart murmur   ? Hyperlipidemia   ? Hypothyroidism   ? Internal hemorrhoids   ? Osteopenia   ? Thyroid disease   ? Phreesia 05/12/2020  ? ? ?Past Surgical History:  ?Procedure Laterality Date  ? APPENDECTOMY    ? BREAST CYST ASPIRATION Left   ? COLONOSCOPY WITH PROPOFOL N/A 05/28/2015  ? Procedure: COLONOSCOPY WITH PROPOFOL;  Surgeon: Manya Silvas, MD;  Location: [facility] ENDOSCOPY;  Service: Endoscopy;  Laterality: N/A;  ? COLONOSCOPY WITH PROPOFOL N/A 08/03/2020  ? Procedure: COLONOSCOPY WITH PROPOFOL;  Surgeon: Lucilla Lame, MD;  Location: [facility] ENDOSCOPY;  Service: Endoscopy;  Laterality: N/A;  ? FRACTURE SURGERY N/A   ? Phreesia 05/12/2020  ? LAPAROSCOPIC SALPINGO OOPHERECTOMY Left   ?  OOPHORECTOMY    ? ? ?Current Outpatient Medications  ?Medication Sig Dispense Refill  ? Calcium-Vitamin D (CALTRATE 600 PLUS-VIT D PO) Take by mouth.    ? Estradiol 10 MCG TABS vaginal tablet Place one tablet vaginally 2 x a week at hs 24 tablet 3  ? LEVOXYL 50 MCG tablet TAKE 1 TABLET BY MOUTH EVERY DAY 90 tablet 3  ? lidocaine (XYLOCAINE) 5 % ointment Apply a pea sized amount topically 20 minutes prior to intercourse, then wipe it off just prior to intercourse 30 g 1  ? metroNIDAZOLE (METROCREAM) 0.75 % cream Apply to cheeks and nose every night for rosacea, twice a day if flared. 45 g 3  ? Tavaborole (KERYDIN) 5 % SOLN Apply to toenail every night until improved. 10 mL 1  ? ?No current facility-administered medications for this visit.  ? ? ?Family History  ?Problem Relation Age of Onset  ? Cancer Mother   ?     lung  ? Intracerebral hemorrhage Father   ? Heart disease Father   ? Diabetes Father   ? Kidney disease Father   ? Osteoporosis Sister 35  ? Cancer Maternal Aunt   ?     colon  ? Osteoporosis Maternal Aunt   ? Cancer Maternal Aunt   ?  colon   ? Pancreatic cancer Paternal Aunt   ? Ovarian cancer Paternal Aunt 8  ? Pancreatic cancer Paternal Uncle   ? Heart disease Maternal Grandfather   ? Cancer Paternal Grandmother   ? Breast cancer Neg Hx   ? ? ?Review of Systems  ?All other systems reviewed and are negative. ? ?Exam:   ?There were no vitals taken for this visit.  Weight change: @WEIGHTCHANGE @ Height:      ?Ht Readings from Last 3 Encounters:  ?05/16/21 5\' 2"  (1.575 m)  ?12/30/20 5\' 2"  (1.575 m)  ?08/03/20 5\' 2"  (1.575 m)  ? ? ?General appearance: alert, cooperative and appears stated age ?Head: Normocephalic, without obvious abnormality, atraumatic ?Neck: no adenopathy, supple, symmetrical, trachea midline and thyroid normal to inspection and palpation ?Lungs: clear to auscultation bilaterally ?Cardiovascular: regular rate and rhythm ?Breasts: normal appearance, no masses or tenderness ?Abdomen:  soft, non-tender; non distended,  no masses,  no organomegaly ?Extremities: extremities normal, atraumatic, no cyanosis or edema ?Skin: Skin color, texture, turgor normal. No rashes or lesions ?Lymph nodes: Cervical, supraclavicular, and axillary nodes normal. ?No abnormal inguinal nodes palpated ?Neurologic: Grossly normal ? ? ?Pelvic: External genitalia:  no lesions ?             Urethra:  normal appearing urethra with no masses, tenderness or lesions ?             Bartholins and Skenes: normal    ?             Vagina: atrophic appearing vagina with normal color and discharge, no lesions ?             Cervix: no lesions ?              ?Bimanual Exam:  Uterus:  normal size, contour, position, consistency, mobility, non-tender ?             Adnexa: no mass, fullness, tenderness ?              Rectovaginal: Confirms ?              Anus:  normal sphincter tone, no lesions ? ?Gae Dry chaperoned for the exam. ? ?1. Well woman exam ?Discussed breast self exam ?Discussed calcium and vit D intake ?Mammogram UTD ?Colonoscopy UTD ?Labs with primary ? ?2. Vaginal atrophy ?Not currently using vaginal estrogen, doing okay ? ?3. Osteopenia, unspecified location ?Has a DEXA scheduled with her primary ? ?4. Family history of colon cancer ?Colonoscopy is UTD ? ?5. Dyspareunia in female ?- lidocaine (XYLOCAINE) 5 % ointment; Apply a pea sized amount topically 20 minutes prior to intercourse, then wipe it off just prior to intercourse  Dispense: 30 g; Refill: 1 ? ?6. Postmenopausal bleeding ?- US PELVIS TRANSVAGINAL NON-OB (TV ONLY); Future ? ? ?

## 2022-01-05 ENCOUNTER — Encounter: Payer: Self-pay | Admitting: Obstetrics and Gynecology

## 2022-01-05 ENCOUNTER — Ambulatory Visit (INDEPENDENT_AMBULATORY_CARE_PROVIDER_SITE_OTHER): Payer: 59 | Admitting: Obstetrics and Gynecology

## 2022-01-05 ENCOUNTER — Other Ambulatory Visit: Payer: Self-pay

## 2022-01-05 VITALS — BP 110/60 | HR 76 | Ht 62.0 in | Wt 118.0 lb

## 2022-01-05 DIAGNOSIS — N950 Postmenopausal bleeding: Secondary | ICD-10-CM | POA: Diagnosis not present

## 2022-01-05 DIAGNOSIS — Z01419 Encounter for gynecological examination (general) (routine) without abnormal findings: Secondary | ICD-10-CM | POA: Diagnosis not present

## 2022-01-05 DIAGNOSIS — Z800 Family history of malignant neoplasm of digestive organs: Secondary | ICD-10-CM | POA: Diagnosis not present

## 2022-01-05 DIAGNOSIS — N952 Postmenopausal atrophic vaginitis: Secondary | ICD-10-CM

## 2022-01-05 DIAGNOSIS — M8580 Other specified disorders of bone density and structure, unspecified site: Secondary | ICD-10-CM

## 2022-01-05 DIAGNOSIS — N941 Unspecified dyspareunia: Secondary | ICD-10-CM

## 2022-01-05 MED ORDER — LIDOCAINE 5 % EX OINT: TOPICAL_OINTMENT | CUTANEOUS | 1 refills | Status: DC

## 2022-01-05 NOTE — Patient Instructions (Signed)

## 2022-01-05 NOTE — Addendum Note (Signed)
Addended by: Dorothy Spark on: 01/05/2022 09:28 AM ? ? Modules accepted: Orders ? ?

## 2022-01-23 NOTE — Progress Notes (Signed)
GYNECOLOGY  VISIT ?  ?HPI: ?61 y.o.   Married White or Caucasian Not Hispanic or Latino  female   ?G0P0000 with No LMP recorded. Patient is postmenopausal.   ?here for evaluation of PMP spotting. The spotting occurred the day after she inserted the tablet in 10/22. No bleeding since then.  ? ?GYNECOLOGIC HISTORY: ?No LMP recorded. Patient is postmenopausal. ?Contraception:PMP ?Menopausal hormone therapy: only vaginal estrogen ?       ?OB History   ? ? Gravida  ?0  ? Para  ?0  ? Term  ?0  ? Preterm  ?0  ? AB  ?0  ? Living  ?0  ?  ? ? SAB  ?0  ? IAB  ?0  ? Ectopic  ?0  ? Multiple  ?0  ? Live Births  ?0  ?   ?  ?  ?    ? ?Patient Active Problem List  ? Diagnosis Date Noted  ? Encounter for screening colonoscopy   ? High cholesterol 12/30/2019  ? Osteopenia   ? Hypothyroidism   ? Biliary dyskinesia   ? Internal hemorrhoids   ? ? ?Past Medical History:  ?Diagnosis Date  ? Biliary anomaly   ? Biliary dyskinesia   ? Endometriosis   ? Heart murmur   ? Hyperlipidemia   ? Hypothyroidism   ? Internal hemorrhoids   ? Osteopenia   ? Thyroid disease   ? Phreesia 05/12/2020  ? ? ?Past Surgical History:  ?Procedure Laterality Date  ? APPENDECTOMY    ? BREAST CYST ASPIRATION Left   ? COLONOSCOPY WITH PROPOFOL N/A 05/28/2015  ? Procedure: COLONOSCOPY WITH PROPOFOL;  Surgeon: Manya Silvas, MD;  Location: [facility] ENDOSCOPY;  Service: Endoscopy;  Laterality: N/A;  ? COLONOSCOPY WITH PROPOFOL N/A 08/03/2020  ? Procedure: COLONOSCOPY WITH PROPOFOL;  Surgeon: Lucilla Lame, MD;  Location: [facility] ENDOSCOPY;  Service: Endoscopy;  Laterality: N/A;  ? FRACTURE SURGERY N/A   ? Phreesia 05/12/2020  ? LAPAROSCOPIC SALPINGO OOPHERECTOMY Left   ? OOPHORECTOMY    ? ? ?Current Outpatient Medications  ?Medication Sig Dispense Refill  ? Calcium-Vitamin D (CALTRATE 600 PLUS-VIT D PO) Take by mouth.    ? Estradiol 10 MCG TABS vaginal tablet Place one tablet vaginally 2 x a week at hs 24 tablet 3  ? LEVOXYL 50 MCG tablet TAKE 1 TABLET BY MOUTH EVERY DAY 90  tablet 3  ? lidocaine (XYLOCAINE) 5 % ointment Apply a pea sized amount topically 20 minutes prior to intercourse, then wipe it off just prior to intercourse 30 g 1  ? metroNIDAZOLE (METROCREAM) 0.75 % cream Apply to cheeks and nose every night for rosacea, twice a day if flared. 45 g 3  ? Tavaborole (KERYDIN) 5 % SOLN Apply to toenail every night until improved. 10 mL 1  ? ?No current facility-administered medications for this visit.  ?  ? ?ALLERGIES: Patient has no known allergies. ? ?Family History  ?Problem Relation Age of Onset  ? Cancer Mother   ?     lung  ? Intracerebral hemorrhage Father   ? Heart disease Father   ? Diabetes Father   ? Kidney disease Father   ? Osteoporosis Sister 30  ? Cancer Maternal Aunt   ?     colon  ? Osteoporosis Maternal Aunt   ? Cancer Maternal Aunt   ?     colon   ? Pancreatic cancer Paternal Aunt   ? Ovarian cancer Paternal Aunt 18  ? Pancreatic cancer  Paternal Uncle   ? Heart disease Maternal Grandfather   ? Cancer Paternal Grandmother   ? Breast cancer Neg Hx   ? ? ?Social History  ? ?Socioeconomic History  ? Marital status: Married  ?  Spouse name: Not on file  ? Number of children: Not on file  ? Years of education: Not on file  ? Highest education level: Not on file  ?Occupational History  ? Not on file  ?Tobacco Use  ? Smoking status: Never  ? Smokeless tobacco: Never  ?Vaping Use  ? Vaping Use: Never used  ?Substance and Sexual Activity  ? Alcohol use: Yes  ?  Alcohol/week: 1.0 standard drink  ?  Types: 1 Standard drinks or equivalent per week  ?  Comment: Social  ? Drug use: No  ? Sexual activity: Yes  ?  Partners: Male  ?  Birth control/protection: Post-menopausal  ?  Comment: Married to [employer], works at [employer]  ?Other Topics Concern  ? Not on file  ?Social History Narrative  ? Not on file  ? ?Social Determinants of Health  ? ?Financial Resource Strain: Not on file  ?Food Insecurity: Not on file  ?Transportation Needs: Not on file  ?Physical Activity: Not on file  ?Stress:  Not on file  ?Social Connections: Not on file  ?Intimate Partner Violence: Not on file  ? ? ?ROS ? ?Pelvic ultrasound ? ?Indications:  ? ?Findings: ? ?Uterus 5.47 x 3.23 x 1.62 cm, anteverted ? ?Endometrium 5.07 mm, heterogeneous endometrium with complex, cystic appearance, ? Vascularity  ? ?Left ovary not seen, prior LSO, cystic structure in the left adnexa is just vessels  ? ?Right ovary 1.79 x 0.59 x 1.01 cm, atrophic, + perfusion ? ?No free fluid ? ?Impression:  ?Normal sized, anteverted uterus ?Thickened endometrium with cystic changes ?Normal right ovary ?Left ovary surgically absent ? ? ?PHYSICAL EXAMINATION:   ? ?BP 134/82   Pulse 67   Ht '5\' 2"'$  (1.575 m)   Wt 118 lb (53.5 kg)   SpO2 99%   BMI 21.58 kg/m?     ?General appearance: alert, cooperative and appears stated age ?Neck: no adenopathy, supple, symmetrical, trachea midline and thyroid normal to inspection and palpation ?Heart: regular rate and rhythm ?Lungs: CTAB ?Abdomen: soft, non-tender; bowel sounds normal; no masses,  no organomegaly ?Extremities: normal, atraumatic, no cyanosis ?Skin: normal color, texture and turgor, no rashes or lesions ?Lymph: normal cervical supraclavicular and inguinal nodes ?Neurologic: grossly normal ? ?1. Postmenopausal bleeding ?Thickened, cystic appearing endometrium, concerning for a polyp ?Plan: hysteroscopy, dilation and curettage. Reviewed risks, including: bleeding, infection, uterine perforation, damage to nearby organs, need for further surgery ?-ACOG handouts on surgery given ? ?2. Cervical stenosis (uterine cervix) ?- misoprostol (CYTOTEC) 200 MCG tablet; Place 2 tablets vaginally the night prior to surgery  Dispense: 2 tablet; Refill: 0 ? ?In addition to reviewing the ultrasound, ~20 minutes was spent in total patient care including examination and counseling.  ?

## 2022-01-23 NOTE — H&P (View-Only) (Signed)
GYNECOLOGY  VISIT ?  ?HPI: ?61 y.o.   Married White or Caucasian Not Hispanic or Latino  female   ?G0P0000 with No LMP recorded. Patient is postmenopausal.   ?here for evaluation of PMP spotting. The spotting occurred the day after she inserted the tablet in 10/22. No bleeding since then.  ? ?GYNECOLOGIC HISTORY: ?No LMP recorded. Patient is postmenopausal. ?Contraception:PMP ?Menopausal hormone therapy: only vaginal estrogen ?       ?OB History   ? ? Gravida  ?0  ? Para  ?0  ? Term  ?0  ? Preterm  ?0  ? AB  ?0  ? Living  ?0  ?  ? ? SAB  ?0  ? IAB  ?0  ? Ectopic  ?0  ? Multiple  ?0  ? Live Births  ?0  ?   ?  ?  ?    ? ?Patient Active Problem List  ? Diagnosis Date Noted  ? Encounter for screening colonoscopy   ? High cholesterol 12/30/2019  ? Osteopenia   ? Hypothyroidism   ? Biliary dyskinesia   ? Internal hemorrhoids   ? ? ?Past Medical History:  ?Diagnosis Date  ? Biliary anomaly   ? Biliary dyskinesia   ? Endometriosis   ? Heart murmur   ? Hyperlipidemia   ? Hypothyroidism   ? Internal hemorrhoids   ? Osteopenia   ? Thyroid disease   ? Phreesia 05/12/2020  ? ? ?Past Surgical History:  ?Procedure Laterality Date  ? APPENDECTOMY    ? BREAST CYST ASPIRATION Left   ? COLONOSCOPY WITH PROPOFOL N/A 05/28/2015  ? Procedure: COLONOSCOPY WITH PROPOFOL;  Surgeon: Manya Silvas, MD;  Location: [facility] ENDOSCOPY;  Service: Endoscopy;  Laterality: N/A;  ? COLONOSCOPY WITH PROPOFOL N/A 08/03/2020  ? Procedure: COLONOSCOPY WITH PROPOFOL;  Surgeon: Lucilla Lame, MD;  Location: [facility] ENDOSCOPY;  Service: Endoscopy;  Laterality: N/A;  ? FRACTURE SURGERY N/A   ? Phreesia 05/12/2020  ? LAPAROSCOPIC SALPINGO OOPHERECTOMY Left   ? OOPHORECTOMY    ? ? ?Current Outpatient Medications  ?Medication Sig Dispense Refill  ? Calcium-Vitamin D (CALTRATE 600 PLUS-VIT D PO) Take by mouth.    ? Estradiol 10 MCG TABS vaginal tablet Place one tablet vaginally 2 x a week at hs 24 tablet 3  ? LEVOXYL 50 MCG tablet TAKE 1 TABLET BY MOUTH EVERY DAY 90  tablet 3  ? lidocaine (XYLOCAINE) 5 % ointment Apply a pea sized amount topically 20 minutes prior to intercourse, then wipe it off just prior to intercourse 30 g 1  ? metroNIDAZOLE (METROCREAM) 0.75 % cream Apply to cheeks and nose every night for rosacea, twice a day if flared. 45 g 3  ? Tavaborole (KERYDIN) 5 % SOLN Apply to toenail every night until improved. 10 mL 1  ? ?No current facility-administered medications for this visit.  ?  ? ?ALLERGIES: Patient has no known allergies. ? ?Family History  ?Problem Relation Age of Onset  ? Cancer Mother   ?     lung  ? Intracerebral hemorrhage Father   ? Heart disease Father   ? Diabetes Father   ? Kidney disease Father   ? Osteoporosis Sister 84  ? Cancer Maternal Aunt   ?     colon  ? Osteoporosis Maternal Aunt   ? Cancer Maternal Aunt   ?     colon   ? Pancreatic cancer Paternal Aunt   ? Ovarian cancer Paternal Aunt 98  ? Pancreatic cancer  Paternal Uncle   ? Heart disease Maternal Grandfather   ? Cancer Paternal Grandmother   ? Breast cancer Neg Hx   ? ? ?Social History  ? ?Socioeconomic History  ? Marital status: Married  ?  Spouse name: Not on file  ? Number of children: Not on file  ? Years of education: Not on file  ? Highest education level: Not on file  ?Occupational History  ? Not on file  ?Tobacco Use  ? Smoking status: Never  ? Smokeless tobacco: Never  ?Vaping Use  ? Vaping Use: Never used  ?Substance and Sexual Activity  ? Alcohol use: Yes  ?  Alcohol/week: 1.0 standard drink  ?  Types: 1 Standard drinks or equivalent per week  ?  Comment: Social  ? Drug use: No  ? Sexual activity: Yes  ?  Partners: Male  ?  Birth control/protection: Post-menopausal  ?  Comment: Married to [employer], works at [employer]  ?Other Topics Concern  ? Not on file  ?Social History Narrative  ? Not on file  ? ?Social Determinants of Health  ? ?Financial Resource Strain: Not on file  ?Food Insecurity: Not on file  ?Transportation Needs: Not on file  ?Physical Activity: Not on file  ?Stress:  Not on file  ?Social Connections: Not on file  ?Intimate Partner Violence: Not on file  ? ? ?ROS ? ?Pelvic ultrasound ? ?Indications:  ? ?Findings: ? ?Uterus 5.47 x 3.23 x 1.62 cm, anteverted ? ?Endometrium 5.07 mm, heterogeneous endometrium with complex, cystic appearance, ? Vascularity  ? ?Left ovary not seen, prior LSO, cystic structure in the left adnexa is just vessels  ? ?Right ovary 1.79 x 0.59 x 1.01 cm, atrophic, + perfusion ? ?No free fluid ? ?Impression:  ?Normal sized, anteverted uterus ?Thickened endometrium with cystic changes ?Normal right ovary ?Left ovary surgically absent ? ? ?PHYSICAL EXAMINATION:   ? ?BP 134/82   Pulse 67   Ht '5\' 2"'$  (1.575 m)   Wt 118 lb (53.5 kg)   SpO2 99%   BMI 21.58 kg/m?     ?General appearance: alert, cooperative and appears stated age ?Neck: no adenopathy, supple, symmetrical, trachea midline and thyroid normal to inspection and palpation ?Heart: regular rate and rhythm ?Lungs: CTAB ?Abdomen: soft, non-tender; bowel sounds normal; no masses,  no organomegaly ?Extremities: normal, atraumatic, no cyanosis ?Skin: normal color, texture and turgor, no rashes or lesions ?Lymph: normal cervical supraclavicular and inguinal nodes ?Neurologic: grossly normal ? ?1. Postmenopausal bleeding ?Thickened, cystic appearing endometrium, concerning for a polyp ?Plan: hysteroscopy, dilation and curettage. Reviewed risks, including: bleeding, infection, uterine perforation, damage to nearby organs, need for further surgery ?-ACOG handouts on surgery given ? ?2. Cervical stenosis (uterine cervix) ?- misoprostol (CYTOTEC) 200 MCG tablet; Place 2 tablets vaginally the night prior to surgery  Dispense: 2 tablet; Refill: 0 ? ?In addition to reviewing the ultrasound, ~20 minutes was spent in total patient care including examination and counseling.  ?

## 2022-01-31 ENCOUNTER — Other Ambulatory Visit: Payer: Self-pay

## 2022-01-31 ENCOUNTER — Ambulatory Visit: Payer: 59 | Admitting: Obstetrics and Gynecology

## 2022-01-31 ENCOUNTER — Ambulatory Visit (INDEPENDENT_AMBULATORY_CARE_PROVIDER_SITE_OTHER): Payer: 59

## 2022-01-31 ENCOUNTER — Encounter: Payer: Self-pay | Admitting: Obstetrics and Gynecology

## 2022-01-31 VITALS — BP 134/82 | HR 67 | Ht 62.0 in | Wt 118.0 lb

## 2022-01-31 DIAGNOSIS — N950 Postmenopausal bleeding: Secondary | ICD-10-CM | POA: Diagnosis not present

## 2022-01-31 DIAGNOSIS — N882 Stricture and stenosis of cervix uteri: Secondary | ICD-10-CM | POA: Diagnosis not present

## 2022-01-31 MED ORDER — MISOPROSTOL 200 MCG PO TABS: ORAL_TABLET | ORAL | 0 refills | Status: DC

## 2022-02-09 ENCOUNTER — Telehealth: Payer: Self-pay | Admitting: *Deleted

## 2022-02-09 ENCOUNTER — Telehealth: Payer: Self-pay | Admitting: Obstetrics and Gynecology

## 2022-02-09 NOTE — Telephone Encounter (Signed)
Surgery: CPT [phone] - Hysteroscopy/D&C/Myosure ? ?Diagnosis: N95.0 Postmenopausal Bleeding, R93.89 Thickened Endometrium ? ?Location: [place] ? ?Status: Outpatient ? ?Time: 30 Minutes ? ?Assistant: N/A ? ?Urgency: At Patient's Convenience ? ?Pre-Op Appointment: Completed ? ?Post-Op Appointment(s): 1 Week ? ?Time Out Of Work: Day Of Surgery, 1 Day Post Op ? ?

## 2022-02-09 NOTE — Telephone Encounter (Signed)
Spoke with patient. Reviewed surgery dates. Patient request to proceed with surgery on 02/28/22.  Advised patient I will forward to business office for return call. I will return call once surgery date and time confirmed. Patient verbalizes understanding and is agreeable.  ? ?Surgery request sent.  ? ?

## 2022-02-09 NOTE — Telephone Encounter (Signed)
Sent!

## 2022-02-09 NOTE — Telephone Encounter (Signed)
Patient left message on surgery line requesting to proceed with surgery discussed at 01/31/22 OV.  ? ?Dr. Talbert Nan, please send surgery request.  ?

## 2022-02-09 NOTE — Telephone Encounter (Signed)
See 2nd telephone encounter dated 02/09/22.  ? ?Encounter closed.  ?

## 2022-02-15 NOTE — Telephone Encounter (Signed)
Spoke with patient. Surgery date request confirmed.  ?Advised surgery is scheduled for 02/28/22 at [place], [facility].  ?Surgery instruction sheet and hospital brochure reviewed, printed copy will be mailed.  ?Patient verbalizes understanding and is agreeable.  ?Routing to [facility] for benefits.  ? ?Encounter may be closed once benefits are reviewed.  ? ?

## 2022-02-16 NOTE — Telephone Encounter (Signed)
Spoke with patient regarding surgery benefits. Patient acknowledges understanding of information presented. Patient is aware that benefits presented are professional benefits only. Patient is aware the hospital will call with facility benefits. See account note. ? ?Encounter closed. ?

## 2022-02-24 ENCOUNTER — Encounter (HOSPITAL_BASED_OUTPATIENT_CLINIC_OR_DEPARTMENT_OTHER): Payer: Self-pay | Admitting: Obstetrics and Gynecology

## 2022-02-24 ENCOUNTER — Other Ambulatory Visit: Payer: Self-pay

## 2022-02-24 NOTE — Progress Notes (Signed)
Spoke w/ via phone for pre-op interview--- pt ?Lab needs dos----  no             ?Lab results------ no ?COVID test -----patient states asymptomatic no test needed ?Arrive at ------- 0530 on 02-28-2022 ?NPO after MN NO Solid Food.  Clear liquids from MN until--- 0430 ?Med rec completed ?Medications to take morning of surgery ----- levoxyl ?Diabetic medication ----- n/a ?Patient instructed no nail polish to be worn day of surgery ?Patient instructed to bring photo id and insurance card day of surgery ?Patient aware to have Driver (ride ) / caregiver for 24 hours after surgery --- husband, tim ?Patient Special Instructions ----- n/a ?Pre-Op special Istructions ----- n/a ?Patient verbalized understanding of instructions that were given at this phone interview. ?Patient denies shortness of breath, chest pain, fever, cough at this phone interview.  ?

## 2022-02-28 ENCOUNTER — Encounter (HOSPITAL_BASED_OUTPATIENT_CLINIC_OR_DEPARTMENT_OTHER): Payer: Self-pay | Admitting: Obstetrics and Gynecology

## 2022-02-28 ENCOUNTER — Ambulatory Visit (HOSPITAL_BASED_OUTPATIENT_CLINIC_OR_DEPARTMENT_OTHER): Payer: 59 | Admitting: Certified Registered Nurse Anesthetist

## 2022-02-28 ENCOUNTER — Encounter (HOSPITAL_BASED_OUTPATIENT_CLINIC_OR_DEPARTMENT_OTHER)
Admission: RE | Disposition: A | Discharge status: Home / Self Care | Payer: Self-pay | Source: Home / Self Care | Attending: Obstetrics and Gynecology

## 2022-02-28 ENCOUNTER — Other Ambulatory Visit: Payer: Self-pay

## 2022-02-28 ENCOUNTER — Ambulatory Visit (HOSPITAL_BASED_OUTPATIENT_CLINIC_OR_DEPARTMENT_OTHER)
Admission: RE | Admit: 2022-02-28 | Discharge: 2022-02-28 | Disposition: A | Discharge status: Home / Self Care | Payer: 59 | Attending: Obstetrics and Gynecology | Admitting: Obstetrics and Gynecology

## 2022-02-28 DIAGNOSIS — N882 Stricture and stenosis of cervix uteri: Secondary | ICD-10-CM | POA: Insufficient documentation

## 2022-02-28 DIAGNOSIS — N950 Postmenopausal bleeding: Secondary | ICD-10-CM | POA: Insufficient documentation

## 2022-02-28 DIAGNOSIS — N840 Polyp of corpus uteri: Secondary | ICD-10-CM | POA: Insufficient documentation

## 2022-02-28 DIAGNOSIS — R9389 Abnormal findings on diagnostic imaging of other specified body structures: Secondary | ICD-10-CM | POA: Diagnosis not present

## 2022-02-28 DIAGNOSIS — N858 Other specified noninflammatory disorders of uterus: Secondary | ICD-10-CM | POA: Diagnosis not present

## 2022-02-28 HISTORY — DX: Postmenopausal bleeding: N95.0

## 2022-02-28 HISTORY — DX: Abnormal findings on diagnostic imaging of other specified body structures: R93.89

## 2022-02-28 HISTORY — PX: DILATATION & CURETTAGE/HYSTEROSCOPY WITH MYOSURE: SHX6511

## 2022-02-28 HISTORY — DX: Rosacea, unspecified: L71.9

## 2022-02-28 HISTORY — DX: Presence of spectacles and contact lenses: Z97.3

## 2022-02-28 SURGERY — DILATATION & CURETTAGE/HYSTEROSCOPY WITH MYOSURE
Anesthesia: General | Site: Vagina

## 2022-02-28 MED ORDER — KETOROLAC TROMETHAMINE 30 MG/ML IJ SOLN
INTRAMUSCULAR | Status: DC | PRN
  Administered 2022-02-28: 30 mg via INTRAVENOUS

## 2022-02-28 MED ORDER — LIDOCAINE HCL (PF) 2 % IJ SOLN
INTRAMUSCULAR | Status: AC
  Filled 2022-02-28: qty 5

## 2022-02-28 MED ORDER — DEXAMETHASONE SODIUM PHOSPHATE 10 MG/ML IJ SOLN
INTRAMUSCULAR | Status: AC
  Filled 2022-02-28: qty 1

## 2022-02-28 MED ORDER — FENTANYL CITRATE (PF) 100 MCG/2ML IJ SOLN
INTRAMUSCULAR | Status: DC | PRN
  Administered 2022-02-28 (×4): 25 ug via INTRAVENOUS

## 2022-02-28 MED ORDER — DEXAMETHASONE SODIUM PHOSPHATE 10 MG/ML IJ SOLN
INTRAMUSCULAR | Status: DC | PRN
  Administered 2022-02-28: 10 mg via INTRAVENOUS

## 2022-02-28 MED ORDER — MIDAZOLAM HCL 2 MG/2ML IJ SOLN
INTRAMUSCULAR | Status: AC
  Filled 2022-02-28: qty 2

## 2022-02-28 MED ORDER — FENTANYL CITRATE (PF) 100 MCG/2ML IJ SOLN: 25.0000 ug | INTRAMUSCULAR | Status: DC | PRN

## 2022-02-28 MED ORDER — FENTANYL CITRATE (PF) 100 MCG/2ML IJ SOLN
INTRAMUSCULAR | Status: AC
Start: 2022-02-28 — End: ?
  Filled 2022-02-28: qty 2

## 2022-02-28 MED ORDER — LACTATED RINGERS IV SOLN: INTRAVENOUS | Status: DC

## 2022-02-28 MED ORDER — SODIUM CHLORIDE 0.9 % IR SOLN
Status: DC | PRN
  Administered 2022-02-28: 3000 mL

## 2022-02-28 MED ORDER — KETOROLAC TROMETHAMINE 30 MG/ML IJ SOLN: 30.0000 mg | Freq: Once | INTRAMUSCULAR | Status: DC | PRN

## 2022-02-28 MED ORDER — ONDANSETRON HCL 4 MG/2ML IJ SOLN
INTRAMUSCULAR | Status: AC
  Filled 2022-02-28: qty 2

## 2022-02-28 MED ORDER — MIDAZOLAM HCL 5 MG/5ML IJ SOLN
INTRAMUSCULAR | Status: DC | PRN
Start: 2022-02-28 — End: 2022-02-28
  Administered 2022-02-28: 2 mg via INTRAVENOUS

## 2022-02-28 MED ORDER — ACETAMINOPHEN 500 MG PO TABS
ORAL_TABLET | ORAL | Status: AC
  Filled 2022-02-28: qty 2

## 2022-02-28 MED ORDER — ACETAMINOPHEN 500 MG PO TABS
1000.0000 mg | ORAL_TABLET | ORAL | Status: AC
  Administered 2022-02-28: 1000 mg via ORAL

## 2022-02-28 MED ORDER — PROPOFOL 10 MG/ML IV BOLUS
INTRAVENOUS | Status: DC | PRN
Start: 2022-02-28 — End: 2022-02-28
  Administered 2022-02-28: 160 mg via INTRAVENOUS

## 2022-02-28 MED ORDER — ONDANSETRON HCL 4 MG/2ML IJ SOLN: 4.0000 mg | Freq: Once | INTRAMUSCULAR | Status: DC | PRN

## 2022-02-28 MED ORDER — LIDOCAINE 2% (20 MG/ML) 5 ML SYRINGE
INTRAMUSCULAR | Status: DC | PRN
Start: 2022-02-28 — End: 2022-02-28
  Administered 2022-02-28: 100 mg via INTRAVENOUS

## 2022-02-28 MED ORDER — ONDANSETRON HCL 4 MG/2ML IJ SOLN
INTRAMUSCULAR | Status: DC | PRN
  Administered 2022-02-28: 4 mg via INTRAVENOUS

## 2022-02-28 MED ORDER — PROPOFOL 500 MG/50ML IV EMUL
INTRAVENOUS | Status: AC
  Filled 2022-02-28: qty 50

## 2022-02-28 SURGICAL SUPPLY — 22 items
CATH ROBINSON RED A/P 16FR (CATHETERS) IMPLANT
DEVICE MYOSURE LITE (MISCELLANEOUS) IMPLANT
DEVICE MYOSURE REACH (MISCELLANEOUS) ×1 IMPLANT
DILATOR CANAL MILEX (MISCELLANEOUS) IMPLANT
DRSG TELFA 3X8 NADH (GAUZE/BANDAGES/DRESSINGS) ×2 IMPLANT
GAUZE 4X4 16PLY ~~LOC~~+RFID DBL (SPONGE) ×3 IMPLANT
GLOVE BIO SURGEON STRL SZ 6.5 (GLOVE) ×2 IMPLANT
GOWN STRL REUS W/TWL LRG LVL3 (GOWN DISPOSABLE) ×2 IMPLANT
IV NS IRRIG 3000ML ARTHROMATIC (IV SOLUTION) ×2 IMPLANT
KIT PROCEDURE FLUENT (KITS) ×2 IMPLANT
KIT TURNOVER CYSTO (KITS) ×2 IMPLANT
MYOSURE XL FIBROID (MISCELLANEOUS)
PACK VAGINAL MINOR WOMEN LF (CUSTOM PROCEDURE TRAY) ×2 IMPLANT
PAD DRESSING TELFA 3X8 NADH (GAUZE/BANDAGES/DRESSINGS) ×1 IMPLANT
PAD OB MATERNITY 4.3X12.25 (PERSONAL CARE ITEMS) ×2 IMPLANT
PAD PREP 24X48 CUFFED NSTRL (MISCELLANEOUS) ×2 IMPLANT
SEAL CERVICAL OMNI LOK (ABLATOR) IMPLANT
SEAL ROD LENS SCOPE MYOSURE (ABLATOR) ×2 IMPLANT
SYR 20ML LL LF (SYRINGE) IMPLANT
SYSTEM TISS REMOVAL MYOSURE XL (MISCELLANEOUS) IMPLANT
TOWEL OR 17X26 10 PK STRL BLUE (TOWEL DISPOSABLE) ×2 IMPLANT
WATER STERILE IRR 500ML POUR (IV SOLUTION) IMPLANT

## 2022-02-28 NOTE — Anesthesia Procedure Notes (Signed)
Procedure Name: LMA Insertion ?Date/Time: 02/28/2022 7:37 AM ?Performed by: Genelle Bal, CRNA ?Pre-anesthesia Checklist: Patient identified, Emergency Drugs available, Suction available and Patient being monitored ?Patient Re-evaluated:Patient Re-evaluated prior to induction ?Oxygen Delivery Method: Circle system utilized ?Preoxygenation: Pre-oxygenation with 100% oxygen ?Induction Type: IV induction ?Ventilation: Mask ventilation without difficulty ?LMA: LMA inserted ?LMA Size: 4.0 ?Number of attempts: 1 ?Airway Equipment and Method: Bite block ?Placement Confirmation: positive ETCO2 ?Tube secured with: Tape ?Dental Injury: Teeth and Oropharynx as per pre-operative assessment  ? ? ? ? ?

## 2022-02-28 NOTE — Interval H&P Note (Signed)
History and Physical Interval Note: ? ?02/28/2022 ?7:11 AM ? ?[name]  has presented today for surgery, with the diagnosis of postmenopausal bleeding, thickened endometrium.  The various methods of treatment have been discussed with the patient and family. After consideration of risks, benefits and other options for treatment, the patient has consented to  Procedure(s): ?DILATATION & CURETTAGE/HYSTEROSCOPY WITH MYOSURE (N/A) as a surgical intervention.  The patient's history has been reviewed, patient examined, no change in status, stable for surgery.  I have reviewed the patient's chart and labs.  Questions were answered to the patient's satisfaction.   ? ? ?Natasha Hawkins ? ? ?

## 2022-02-28 NOTE — Anesthesia Preprocedure Evaluation (Signed)
Anesthesia Evaluation  Patient identified by MRN, date of birth, ID band Patient awake    Reviewed: Allergy & Precautions, NPO status , Patient's Chart, lab work & pertinent test results  Airway Mallampati: II  TM Distance: >3 FB Neck ROM: Full    Dental no notable dental hx.    Pulmonary neg pulmonary ROS,    Pulmonary exam normal breath sounds clear to auscultation       Cardiovascular negative cardio ROS Normal cardiovascular exam Rhythm:Regular Rate:Normal     Neuro/Psych negative neurological ROS  negative psych ROS   GI/Hepatic negative GI ROS, Neg liver ROS,   Endo/Other  Hypothyroidism   Renal/GU negative Renal ROS  negative genitourinary   Musculoskeletal negative musculoskeletal ROS (+)   Abdominal   Peds negative pediatric ROS (+)  Hematology negative hematology ROS (+)   Anesthesia Other Findings   Reproductive/Obstetrics negative OB ROS                             Anesthesia Physical Anesthesia Plan  ASA: 2  Anesthesia Plan: General   Post-op Pain Management: Minimal or no pain anticipated   Induction: Intravenous  PONV Risk Score and Plan: 3 and Ondansetron, Dexamethasone and Treatment may vary due to age or medical condition  Airway Management Planned: LMA  Additional Equipment:   Intra-op Plan:   Post-operative Plan: Extubation in OR  Informed Consent: I have reviewed the patients History and Physical, chart, labs and discussed the procedure including the risks, benefits and alternatives for the proposed anesthesia with the patient or authorized representative who has indicated his/her understanding and acceptance.     Dental advisory given  Plan Discussed with: CRNA and Surgeon  Anesthesia Plan Comments:         Anesthesia Quick Evaluation  

## 2022-02-28 NOTE — Transfer of Care (Signed)
Immediate Anesthesia Transfer of Care Note ? ?Patient: Natasha Hawkins ? ?Procedure(s) Performed: DILATATION & CURETTAGE/HYSTEROSCOPY WITH MYOSURE (Vagina ) ? ?Patient Location: PACU ? ?Anesthesia Type:General ? ?Level of Consciousness: awake, alert  and oriented ? ?Airway & Oxygen Therapy: Patient Spontanous Breathing and Patient connected to face mask oxygen ? ?Post-op Assessment: Report given to RN and Post -op Vital signs reviewed and stable ? ?Post vital signs: Reviewed and stable ? ?Last Vitals:  ?Vitals Value Taken Time  ?BP 120/75 02/28/22 0809  ?Temp    ?Pulse 63 02/28/22 0811  ?Resp 13 02/28/22 0811  ?SpO2 100 % 02/28/22 0811  ?Vitals shown include unvalidated device data. ? ?Last Pain:  ?Vitals:  ? 02/28/22 0604  ?TempSrc: Oral  ?PainSc: 0-No pain  ?   ? ?Patients Stated Pain Goal: 6 (02/28/22 0604) ? ?Complications: No notable events documented. ?

## 2022-02-28 NOTE — Anesthesia Postprocedure Evaluation (Signed)
Anesthesia Post Note ? ?Patient: Natasha Hawkins ? ?Procedure(s) Performed: DILATATION & CURETTAGE/HYSTEROSCOPY WITH MYOSURE (Vagina ) ? ?  ? ?Patient location during evaluation: PACU ?Anesthesia Type: General ?Level of consciousness: awake and alert ?Pain management: pain level controlled ?Vital Signs Assessment: post-procedure vital signs reviewed and stable ?Respiratory status: spontaneous breathing, nonlabored ventilation, respiratory function stable and patient connected to nasal cannula oxygen ?Cardiovascular status: blood pressure returned to baseline and stable ?Postop Assessment: no apparent nausea or vomiting ?Anesthetic complications: no ? ? ?No notable events documented. ? ?Last Vitals:  ?Vitals:  ? 02/28/22 0815 02/28/22 0820  ?BP: 122/76   ?Pulse:  80  ?Resp: 12 19  ?Temp:    ?SpO2: 100% 100%  ?  ?Last Pain:  ?Vitals:  ? 02/28/22 0820  ?TempSrc:   ?PainSc: 0-No pain  ? ? ?  ?  ?  ?  ?  ?  ? ?[name] ? ? ? ? ?

## 2022-02-28 NOTE — Discharge Instructions (Signed)
  Post Anesthesia Home Care Instructions  Activity: Get plenty of rest for the remainder of the day. A responsible individual must stay with you for 24 hours following the procedure.  For the next 24 hours, DO NOT: -Drive a car -Operate machinery -Drink alcoholic beverages -Take any medication unless instructed by your physician -Make any legal decisions or sign important papers.  Meals: Start with liquid foods such as gelatin or soup. Progress to regular foods as tolerated. Avoid greasy, spicy, heavy foods. If nausea and/or vomiting occur, drink only clear liquids until the nausea and/or vomiting subsides. Call your physician if vomiting continues.  Special Instructions/Symptoms: Your throat may feel dry or sore from the anesthesia or the breathing tube placed in your throat during surgery. If this causes discomfort, gargle with warm salt water. The discomfort should disappear within 24 hours.  If you had a scopolamine patch placed behind your ear for the management of post- operative nausea and/or vomiting:  1. The medication in the patch is effective for 72 hours, after which it should be removed.  Wrap patch in a tissue and discard in the trash. Wash hands thoroughly with soap and water. 2. You may remove the patch earlier than 72 hours if you experience unpleasant side effects which may include dry mouth, dizziness or visual disturbances. 3. Avoid touching the patch. Wash your hands with soap and water after contact with the patch.      D & C Home care Instructions:   Personal hygiene:  Used sanitary napkins for vaginal drainage not tampons. Shower or tub bathe the day after your procedure. No douching until bleeding stops. Always wipe from front to back after  Elimination.  Activity: Do not drive or operate any equipment today. The effects of the anesthesia are still present and drowsiness may result. Rest today, not necessarily flat bed rest, just take it easy. You may resume your  normal activity in one to 2 days.  Sexual activity: No intercourse for one week or as indicated by your physician  Diet: Eat a light diet as desired this evening. You may resume a regular diet tomorrow.  Return to work: One to 2 days.  General Expectations of your surgery: Vaginal bleeding should be no heavier than a normal period. Spotting may continue up to 10 days. Mild cramps may continue for a couple of days. You may have a regular period in 2-6 weeks.  Unexpected observations call your doctor if these occur: persistent or heavy bleeding. Severe abdominal cramping or pain. Elevation of temperature greater than 100F.  Call for an appointment in one week.   

## 2022-02-28 NOTE — Op Note (Signed)
Preoperative Diagnosis: postmenopausal bleeding, thickened endometrium ? ?Postoperative Diagnosis: postmenopausal bleeding, endometrial mass ? ?Procedure: Hysteroscopy, resection of endometrial mass, dilation and curettage ? ?Surgeon: Dr Sumner Boast ? ?Assistants: None ? ?Anesthesia: General via LMA ? ?EBL: 5 cc ? ?Fluids: 700 cc ? ?Fluid deficit: 75 cc ? ?Urine output: not recorded ? ?Indications for surgery: The patient is a 61 yo female, who presented with postmenopausal spotting. Ultrasound revealed a thickened endometrium concerning for an endometrial polyp.  ?The risks of the surgery were reviewed with the patient and the consent form was signed prior to her surgery. ? ?Findings: Normal sized anteverted uterus. Posterior sessile endometrial mass, otherwise thin endometrium. Normal tubal ostia bilaterally.  ? ?Specimens: endometrial mass, endometrial curetting's. ? ?Procedure: The patient was taken to the operating room with an IV in place. She was placed in the dorsal lithotomy position and anesthesia was administered. She was prepped and draped in the usual sterile fashion for a vaginal procedure. She voided on the way to the OR. A weighted speculum was placed in the vagina and a single tooth tenaculum was placed on the anterior lip of the cervix. The cervix was dilated to a #6 hagar dilator. The uterus was sounded to ~7 cm. The myosure hysteroscope was inserted into the uterine cavity. With continuous infusion of normal saline, the uterine cavity was visualized with the above findings. The myosure reach was used to resect the mass. The myosure was then removed. The cavity was then curetted with the small sharp curette. The cavity had the characteristically gritty texture at the end of the procedure. The curette and the single tooth tenaculum were removed. The speculum was removed. The patients perineum was cleansed of betadine and she was taken out of the dorsal lithotomy position.  ?Upon awakening the LMA  was removed and the patient was transferred to the recovery room in stable and awake condition.  ?The sponge and instrument count were correct. ?There were no complications.  ? ? ?

## 2022-03-01 ENCOUNTER — Telehealth: Payer: Self-pay | Admitting: *Deleted

## 2022-03-01 LAB — SURGICAL PATHOLOGY

## 2022-03-01 NOTE — Telephone Encounter (Signed)
-----   Message from Natasha Dom, MD sent at 03/01/2022  1:10 PM EDT ----- ?Please let the patient know that her pathology report from her hysteroscopy, D&C yesterday is benign.  ?Please see how she is feeling.  ?

## 2022-03-01 NOTE — Telephone Encounter (Signed)
Patient said she is doing great! And wanted to thank you. She was had spotting yesterday and still slight soreness when sitting, but feels great.  ?

## 2022-03-02 ENCOUNTER — Encounter (HOSPITAL_BASED_OUTPATIENT_CLINIC_OR_DEPARTMENT_OTHER): Payer: Self-pay | Admitting: Obstetrics and Gynecology

## 2022-03-08 ENCOUNTER — Ambulatory Visit (INDEPENDENT_AMBULATORY_CARE_PROVIDER_SITE_OTHER): Payer: 59 | Admitting: Obstetrics and Gynecology

## 2022-03-08 ENCOUNTER — Encounter: Payer: Self-pay | Admitting: Obstetrics and Gynecology

## 2022-03-08 VITALS — BP 122/60 | HR 87 | Ht 62.0 in | Wt 120.0 lb

## 2022-03-08 DIAGNOSIS — Z9889 Other specified postprocedural states: Secondary | ICD-10-CM

## 2022-03-08 NOTE — Progress Notes (Signed)
GYNECOLOGY  VISIT ?  ?HPI: ?61 y.o.   Married White or Caucasian Not Hispanic or Latino  female   ?[id number] with No LMP recorded. Patient is postmenopausal.   ?here for a post op visit. She is one week s/p hysteroscopy, polypectomy, D&C. Pathology was benign. She is doing well, no complaints. ? ? ?GYNECOLOGIC HISTORY: ?No LMP recorded. Patient is postmenopausal. ?Contraception: PMP ?Menopausal hormone therapy: only on vaginal estrogen Patient states that she is not longer taking these she is unsure if she wants to go back on them.  ?       ?OB History   ? ? Gravida  ?0  ? Para  ?0  ? Term  ?0  ? Preterm  ?0  ? AB  ?0  ? Living  ?0  ?  ? ? SAB  ?0  ? IAB  ?0  ? Ectopic  ?0  ? Multiple  ?0  ? Live Births  ?0  ?   ?  ?  ?    ? ?Patient Active Problem List  ? Diagnosis Date Noted  ? Encounter for screening colonoscopy   ? High cholesterol 12/30/2019  ? Osteopenia   ? Hypothyroidism   ? Biliary dyskinesia   ? Internal hemorrhoids   ? ? ?Past Medical History:  ?Diagnosis Date  ? Biliary dyskinesia   ? per pt told and dx 1990s  ? Endometriosis   ? Heart murmur   ? refer to pcp dr Dennard Schaumann, note in epic 05-16-2021 stated in assessment no murmur heard// (02-24-2022 )per pt was told had MVP approx early 2000s or late 1990s, stated had echo done but has not had one since  ? Hyperlipidemia   ? Hypothyroidism   ? followed by pcp  ? Internal hemorrhoids   ? Osteopenia   ? PMB (postmenopausal bleeding)   ? Rosacea   ? Thickened endometrium   ? Wears contact lenses   ? ? ?Past Surgical History:  ?Procedure Laterality Date  ? APPENDECTOMY    ? child  ? COLONOSCOPY WITH PROPOFOL N/A 05/28/2015  ? Procedure: COLONOSCOPY WITH PROPOFOL;  Surgeon: Manya Silvas, MD;  Location[facility] ENDOSCOPY;  Service: Endoscopy;  Laterality: N/A;  ? COLONOSCOPY WITH PROPOFOL N/A 08/03/2020  ? Procedure: COLONOSCOPY WITH PROPOFOL;  Surgeon: Lucilla Lame, MD;  Location: [facility] ENDOSCOPY;  Service: Endoscopy;  Laterality: N/A;  ? DILATATION &  CURETTAGE/HYSTEROSCOPY WITH MYOSURE N/A 02/28/2022  ? Procedure: DILATATION & CURETTAGE/HYSTEROSCOPY WITH MYOSURE;  Surgeon: [name], MD;  [facility];  Service: Gynecology;  Laterality: N/A;  ? LAPAROSCOPIC SALPINGO OOPHERECTOMY Left 2002  ? ORIF WRIST FRACTURE Left 2012  ? ? ?Current Outpatient Medications  ?Medication Sig Dispense Refill  ? Calcium-Vitamin D (CALTRATE 600 PLUS-VIT D PO) Take by mouth daily. 2 chews    ? Estradiol 10 MCG TABS vaginal tablet Place one tablet vaginally 2 x a week at hs (Patient not taking: Reported on 02/24/2022) 24 tablet 3  ? LEVOXYL 50 MCG tablet TAKE 1 TABLET BY MOUTH EVERY DAY (Patient taking differently: Take 50 mcg by mouth daily before breakfast. TAKE 1 TABLET BY MOUTH EVERY DAY) 90 tablet 3  ? lidocaine (XYLOCAINE) 5 % ointment Apply a pea sized amount topically 20 minutes prior to intercourse, then wipe it off just prior to intercourse 30 g 1  ? metroNIDAZOLE (METROCREAM) 0.75 % cream Apply to cheeks and nose every night for rosacea, twice a day if flared. 45 g 3  ?  Tavaborole (KERYDIN) 5 % SOLN Apply to toenail every night until improved. 10 mL 1  ? ?No current facility-administered medications for this visit.  ?  ? ?ALLERGIES: Patient has no known allergies. ? ?Family History  ?Problem Relation Age of Onset  ? Cancer Mother   ?     lung  ? Intracerebral hemorrhage Father   ? Heart disease Father   ? Diabetes Father   ? Kidney disease Father   ? Osteoporosis Sister 64  ? Cancer Maternal Aunt   ?     colon  ? Osteoporosis Maternal Aunt   ? Cancer Maternal Aunt   ?     colon   ? Pancreatic cancer Paternal Aunt   ? Ovarian cancer Paternal Aunt 60  ? Pancreatic cancer Paternal Uncle   ? Heart disease Maternal Grandfather   ? Cancer Paternal Grandmother   ? Breast cancer Neg Hx   ? ? ?Social History  ? ?Socioeconomic History  ? Marital status: Married  ?  Spouse name: Not on file  ? Number of children: Not on file  ? Years of education: Not  on file  ? Highest education level: Not on file  ?Occupational History  ? Not on file  ?Tobacco Use  ? Smoking status: Never  ? Smokeless tobacco: Never  ?Vaping Use  ? Vaping Use: Never used  ?Substance and Sexual Activity  ? Alcohol use: Not Currently  ?  Comment: very rare  ? Drug use: Never  ? Sexual activity: Yes  ?  Partners: Male  ?  Birth control/protection: Post-menopausal  ?Other Topics Concern  ? Not on file  ?Social History Narrative  ? Not on file  ? ?Social Determinants of Health  ? ?Financial Resource Strain: Not on file  ?Food Insecurity: Not on file  ?Transportation Needs: Not on file  ?Physical Activity: Not on file  ?Stress: Not on file  ?Social Connections: Not on file  ?Intimate Partner Violence: Not on file  ? ? ?Review of Systems  ?All other systems reviewed and are negative. ? ?PHYSICAL EXAMINATION:   ? ?There were no vitals taken for this visit.    ?General appearance: alert, cooperative and appears stated age ?Abdomen: soft, non-tender; non distended, no masses,  no organomegaly ? ? ?1. Status post hysteroscopy ?Doing well, routine f/u ? ?

## 2022-05-04 ENCOUNTER — Other Ambulatory Visit: Payer: Self-pay | Admitting: Family Medicine

## 2022-05-04 ENCOUNTER — Other Ambulatory Visit: Payer: 59

## 2022-05-04 DIAGNOSIS — E7800 Pure hypercholesterolemia, unspecified: Secondary | ICD-10-CM

## 2022-05-04 DIAGNOSIS — E039 Hypothyroidism, unspecified: Secondary | ICD-10-CM

## 2022-05-04 NOTE — Telephone Encounter (Signed)
Requested medication (s) are due for refill today: expired medication  Requested medication (s) are on the active medication list: yes  Last refill:  04/12/21 #90 3 refills  Future visit scheduled: yes 05/18/22  Notes to clinic:  expired medication do you want to renew Rx?     Requested Prescriptions  Pending Prescriptions Disp Refills   LEVOXYL 50 MCG tablet [Pharmacy Med Name: LEVOXYL 50 MCG TABLET] 30 tablet 5    Sig: TAKE TAKE 1 TABLET BY MOUTH EVERYDAY     Endocrinology:  Hypothyroid Agents Failed - 05/04/2022  1:58 AM      Failed - TSH in normal range and within 360 days    TSH  Date Value Ref Range Status  04/22/2015 2.150 0.450 - 4.500 uIU/mL Final         Failed - Valid encounter within last 12 months    Recent Outpatient Visits           11 months ago Routine general medical examination at a health care facility   [place], Cammie Mcgee, MD   1 year ago Colon cancer screening   [place] Susy Frizzle, MD   3 years ago Postmenopausal estrogen deficiency   [place] Dennard Schaumann, Cammie Mcgee, MD   4 years ago Routine general medical examination at a health care facility   [place], Cammie Mcgee, MD   5 years ago Routine general medical examination at a health care facility   [place], Cammie Mcgee, MD       Future Appointments             In 7 months Brendolyn Patty, MD [place]

## 2022-05-05 ENCOUNTER — Other Ambulatory Visit: Payer: 59

## 2022-05-05 LAB — COMPREHENSIVE METABOLIC PANEL
AG Ratio: 1.8 (calc) (ref 1.0–2.5)
ALT: 8 U/L (ref 6–29)
AST: 16 U/L (ref 10–35)
Albumin: 4.5 g/dL (ref 3.6–5.1)
Alkaline phosphatase (APISO): 87 U/L (ref 37–153)
BUN: 12 mg/dL (ref 7–25)
CO2: 28 mmol/L (ref 20–32)
Calcium: 9.6 mg/dL (ref 8.6–10.4)
Chloride: 105 mmol/L (ref 98–110)
Creat: 0.99 mg/dL (ref 0.50–1.05)
Globulin: 2.5 g/dL (calc) (ref 1.9–3.7)
Glucose, Bld: 89 mg/dL (ref 65–99)
Potassium: 4.7 mmol/L (ref 3.5–5.3)
Sodium: 141 mmol/L (ref 135–146)
Total Bilirubin: 0.8 mg/dL (ref 0.2–1.2)
Total Protein: 7 g/dL (ref 6.1–8.1)

## 2022-05-05 LAB — LIPID PANEL
Cholesterol: 243 mg/dL — ABNORMAL HIGH (ref <200)
HDL: 80 mg/dL (ref >=50)
LDL Cholesterol (Calc): 148 mg/dL (calc) — ABNORMAL HIGH
Non-HDL Cholesterol (Calc): 163 mg/dL (calc) — ABNORMAL HIGH (ref <130)
Total CHOL/HDL Ratio: 3 (calc) (ref <5.0)
Triglycerides: 59 mg/dL (ref <150)

## 2022-05-05 LAB — CBC WITH DIFFERENTIAL/PLATELET
Absolute Monocytes: 462 cells/uL (ref 200–950)
Basophils Absolute: 47 cells/uL (ref 0–200)
Basophils Relative: 0.7 %
Eosinophils Absolute: 208 cells/uL (ref 15–500)
Eosinophils Relative: 3.1 %
HCT: 44.2 % (ref 35.0–45.0)
Hemoglobin: 14.8 g/dL (ref 11.7–15.5)
Lymphs Abs: 1735 cells/uL (ref 850–3900)
MCH: 28.4 pg (ref 27.0–33.0)
MCHC: 33.5 g/dL (ref 32.0–36.0)
MCV: 84.7 fL (ref 80.0–100.0)
MPV: 9.8 fL (ref 7.5–12.5)
Monocytes Relative: 6.9 %
Neutro Abs: 4248 cells/uL (ref 1500–7800)
Neutrophils Relative %: 63.4 %
Platelets: 346 10*3/uL (ref 140–400)
RBC: 5.22 10*6/uL — ABNORMAL HIGH (ref 3.80–5.10)
RDW: 12.8 % (ref 11.0–15.0)
Total Lymphocyte: 25.9 %
WBC: 6.7 10*3/uL (ref 3.8–10.8)

## 2022-05-05 LAB — TSH: TSH: 9.21 mIU/L — ABNORMAL HIGH (ref 0.40–4.50)

## 2022-05-18 ENCOUNTER — Encounter: Payer: Self-pay | Admitting: Family Medicine

## 2022-05-18 ENCOUNTER — Ambulatory Visit (INDEPENDENT_AMBULATORY_CARE_PROVIDER_SITE_OTHER): Payer: 59 | Admitting: Family Medicine

## 2022-05-18 VITALS — BP 124/68 | HR 72 | Temp 98.4°F | Ht 62.0 in | Wt 118.0 lb

## 2022-05-18 DIAGNOSIS — M8580 Other specified disorders of bone density and structure, unspecified site: Secondary | ICD-10-CM | POA: Diagnosis not present

## 2022-05-18 DIAGNOSIS — E7800 Pure hypercholesterolemia, unspecified: Secondary | ICD-10-CM

## 2022-05-18 DIAGNOSIS — Z780 Asymptomatic menopausal state: Secondary | ICD-10-CM | POA: Diagnosis not present

## 2022-05-18 DIAGNOSIS — E038 Other specified hypothyroidism: Secondary | ICD-10-CM

## 2022-05-18 DIAGNOSIS — Z0000 Encounter for general adult medical examination without abnormal findings: Secondary | ICD-10-CM | POA: Diagnosis not present

## 2022-05-18 MED ORDER — PRAVASTATIN SODIUM 20 MG PO TABS: 20.0000 mg | ORAL_TABLET | Freq: Every day | ORAL | 3 refills | Status: DC

## 2022-05-18 MED ORDER — LEVOTHYROXINE SODIUM 75 MCG PO TABS: 75.0000 ug | ORAL_TABLET | Freq: Every day | ORAL | 3 refills | Status: DC

## 2022-05-18 NOTE — Progress Notes (Signed)
Subjective:    Patient ID: Natasha Hawkins, female    DOB: Dec 18, 1960, 61 y.o.   MRN: [id number]  HPI Patient is a 61 year old Caucasian female here today for complete physical exam.  DEXA 2020 revealed T score of -1.6 showing osteopenia.    Last colonoscopy was 2021 and Dr. Vira Agar recommended repeat colonoscopy in 5 years. Pap smear was performed at her gynecologist.  He underwent a hysteroscopy earlier this year due to postmenopausal bleeding but thankfully this came back normal.  Her mammogram was performed in February and was normal.  She is due for a tetanus shot as well as a shingles shot.  Her most recent lab work is listed below and shows that her levothyroxine is not appropriately dosed.  Her cholesterol has also risen substantially. Immunization History  Administered Date(s) Administered   Influenza Split 09/06/2013   PFIZER(Purple Top)SARS-COV-2 Vaccination 06/17/2020, 07/08/2020   Td 06/02/1992   Tdap 09/26/2012    Past Medical History:  Diagnosis Date   Biliary dyskinesia    per pt told and dx 1990s   Endometriosis    Heart murmur    refer to pcp dr Ovie Cornelio, note in epic 05-16-2021 stated in assessment no murmur heard// (02-24-2022 )per pt was told had MVP approx early 2000s or late 1990s, stated had echo done but has not had one since   Hyperlipidemia    Hypothyroidism    followed by pcp   Internal hemorrhoids    Osteopenia    PMB (postmenopausal bleeding)    Rosacea    Thickened endometrium    Wears contact lenses    Past Surgical History:  Procedure Laterality Date   APPENDECTOMY     child   COLONOSCOPY WITH PROPOFOL N/A 05/28/2015   Procedure: COLONOSCOPY WITH PROPOFOL;  Surgeon: Manya Silvas, MD;  Location: [place];  Service: Endoscopy;  Laterality: N/A;   COLONOSCOPY WITH PROPOFOL N/A 08/03/2020   Procedure: COLONOSCOPY WITH PROPOFOL;  Surgeon: Lucilla Lame, MD;  Location: [facility] ENDOSCOPY;  Service: Endoscopy;  Laterality: N/A;   DILATATION &  CURETTAGE/HYSTEROSCOPY WITH MYOSURE N/A 02/28/2022   Procedure: DILATATION & CURETTAGE/HYSTEROSCOPY WITH MYOSURE;  Surgeon: [name], MD;  Location: [place];  Service: Gynecology;  Laterality: N/A;   LAPAROSCOPIC SALPINGO OOPHERECTOMY Left 2002   ORIF WRIST FRACTURE Left 2012   Current Outpatient Medications on File Prior to Visit  Medication Sig Dispense Refill   Calcium-Vitamin D (CALTRATE 600 PLUS-VIT D PO) Take by mouth daily. 2 chews     LEVOXYL 50 MCG tablet TAKE 1 TABLET BY MOUTH EVERY DAY (Patient taking differently: Take 50 mcg by mouth daily before breakfast. TAKE 1 TABLET BY MOUTH EVERY DAY) 90 tablet 3   lidocaine (XYLOCAINE) 5 % ointment Apply a pea sized amount topically 20 minutes prior to intercourse, then wipe it off just prior to intercourse 30 g 1   metroNIDAZOLE (METROCREAM) 0.75 % cream Apply to cheeks and nose every night for rosacea, twice a day if flared. 45 g 3   Tavaborole (KERYDIN) 5 % SOLN Apply to toenail every night until improved. 10 mL 1   No current facility-administered medications on file prior to visit.   No Known Allergies Social History   Socioeconomic History   Marital status: Married    Spouse name: Not on file   Number of children: Not on file   Years of education: Not on file   Highest education level: Not on file  Occupational History  Not on file  Tobacco Use   Smoking status: Never   Smokeless tobacco: Never  Vaping Use   Vaping Use: Never used  Substance and Sexual Activity   Alcohol use: Not Currently    Comment: very rare   Drug use: Never   Sexual activity: Yes    Partners: Male    Birth control/protection: Post-menopausal  Other Topics Concern   Not on file  Social History Narrative   Not on file   Social Determinants of Health   Financial Resource Strain: Not on file  Food Insecurity: Not on file  Transportation Needs: Not on file  Physical Activity: Not on file  Stress: Not on file   Social Connections: Not on file  Intimate Partner Violence: Not on file   Family History  Problem Relation Age of Onset   Cancer Mother        lung   Intracerebral hemorrhage Father    Heart disease Father    Diabetes Father    Kidney disease Father    Osteoporosis Sister 48   Cancer Maternal Aunt        colon   Osteoporosis Maternal Aunt    Cancer Maternal Aunt        colon    Pancreatic cancer Paternal Aunt    Ovarian cancer Paternal Aunt 67   Pancreatic cancer Paternal Uncle    Heart disease Maternal Grandfather    Cancer Paternal Grandmother    Breast cancer Neg Hx       Review of Systems     Objective:   Physical Exam Vitals reviewed.  Constitutional:      General: She is not in acute distress.    Appearance: Normal appearance. She is normal weight. She is not ill-appearing, toxic-appearing or diaphoretic.  HENT:     Head: Normocephalic and atraumatic.     Right Ear: Tympanic membrane, ear canal and external ear normal. There is no impacted cerumen.     Left Ear: Tympanic membrane, ear canal and external ear normal. There is no impacted cerumen.     Nose: Nose normal. No congestion.     Mouth/Throat:     Mouth: Mucous membranes are moist.     Pharynx: Oropharynx is clear. No oropharyngeal exudate or posterior oropharyngeal erythema.  Eyes:     General:        Right eye: No discharge.        Left eye: No discharge.     Extraocular Movements: Extraocular movements intact.     Conjunctiva/sclera: Conjunctivae normal.     Pupils: Pupils are equal, round, and reactive to light.  Neck:     Vascular: No carotid bruit.  Cardiovascular:     Rate and Rhythm: Normal rate and regular rhythm.     Pulses: Normal pulses.     Heart sounds: Normal heart sounds. No murmur heard.    No friction rub. No gallop.  Pulmonary:     Effort: Pulmonary effort is normal. No respiratory distress.     Breath sounds: Normal breath sounds. No stridor. No wheezing, rhonchi or rales.   Abdominal:     General: Abdomen is flat. Bowel sounds are normal. There is no distension.     Palpations: Abdomen is soft. There is no mass.     Tenderness: There is no abdominal tenderness. There is no guarding.     Hernia: No hernia is present.  Musculoskeletal:        General: No swelling or deformity. Normal  range of motion.     Cervical back: Normal range of motion and neck supple. No rigidity.     Right lower leg: No edema.     Left lower leg: No edema.  Lymphadenopathy:     Cervical: No cervical adenopathy.  Skin:    General: Skin is warm.     Coloration: Skin is not jaundiced.     Findings: No bruising, erythema, lesion or rash.  Neurological:     General: No focal deficit present.     Mental Status: She is alert and oriented to person, place, and time. Mental status is at baseline.     Cranial Nerves: No cranial nerve deficit.     Sensory: No sensory deficit.     Motor: No weakness.     Coordination: Coordination normal.     Gait: Gait normal.     Deep Tendon Reflexes: Reflexes normal.        Assessment & Plan:  Routine general medical examination at a health care facility  Other specified hypothyroidism  Osteopenia after menopause - Plan: DG Bone Density  Pure hypercholesterolemia Recommended increasing Levoxyl from 50 to 75 mcg a day and recheck a TSH in 6 to 8 weeks.  Start pravastatin 20 mg a day and recheck cholesterol in 3 months.  Recommended a tetanus shot as well as the shingles vaccine.  Colonoscopy and Pap smear are up-to-date.  Mammogram is up-to-date.  I will repeat a bone density test to monitor for osteopenia.

## 2022-06-09 ENCOUNTER — Other Ambulatory Visit: Payer: 59

## 2022-06-15 ENCOUNTER — Ambulatory Visit
Admission: RE | Admit: 2022-06-15 | Discharge: 2022-06-15 | Disposition: A | Discharge status: Home / Self Care | Payer: 59 | Source: Ambulatory Visit | Attending: Family Medicine | Admitting: Family Medicine

## 2022-06-15 DIAGNOSIS — M8589 Other specified disorders of bone density and structure, multiple sites: Secondary | ICD-10-CM | POA: Diagnosis not present

## 2022-06-15 DIAGNOSIS — M8580 Other specified disorders of bone density and structure, unspecified site: Secondary | ICD-10-CM | POA: Diagnosis not present

## 2022-06-15 DIAGNOSIS — Z780 Asymptomatic menopausal state: Secondary | ICD-10-CM | POA: Diagnosis not present

## 2022-07-04 ENCOUNTER — Other Ambulatory Visit: Payer: 59

## 2022-07-04 DIAGNOSIS — E038 Other specified hypothyroidism: Secondary | ICD-10-CM | POA: Diagnosis not present

## 2022-07-04 DIAGNOSIS — E039 Hypothyroidism, unspecified: Secondary | ICD-10-CM | POA: Diagnosis not present

## 2022-07-04 DIAGNOSIS — E7800 Pure hypercholesterolemia, unspecified: Secondary | ICD-10-CM

## 2022-07-05 LAB — TSH: TSH: 1.12 mIU/L (ref 0.40–4.50)

## 2022-07-05 LAB — LIPID PANEL
Cholesterol: 191 mg/dL (ref <200)
HDL: 84 mg/dL (ref >=50)
LDL Cholesterol (Calc): 94 mg/dL (calc)
Non-HDL Cholesterol (Calc): 107 mg/dL (calc) (ref <130)
Total CHOL/HDL Ratio: 2.3 (calc) (ref <5.0)
Triglycerides: 52 mg/dL (ref <150)

## 2022-11-29 DIAGNOSIS — M79645 Pain in left finger(s): Secondary | ICD-10-CM | POA: Diagnosis not present

## 2022-11-29 DIAGNOSIS — M546 Pain in thoracic spine: Secondary | ICD-10-CM | POA: Diagnosis not present

## 2022-12-18 ENCOUNTER — Encounter: Payer: Self-pay | Admitting: Dermatology

## 2022-12-18 ENCOUNTER — Ambulatory Visit: Payer: 59 | Admitting: Dermatology

## 2022-12-18 VITALS — BP 142/91 | HR 78

## 2022-12-18 DIAGNOSIS — L719 Rosacea, unspecified: Secondary | ICD-10-CM

## 2022-12-18 DIAGNOSIS — Z1283 Encounter for screening for malignant neoplasm of skin: Secondary | ICD-10-CM | POA: Diagnosis not present

## 2022-12-18 DIAGNOSIS — L820 Inflamed seborrheic keratosis: Secondary | ICD-10-CM

## 2022-12-18 DIAGNOSIS — L578 Other skin changes due to chronic exposure to nonionizing radiation: Secondary | ICD-10-CM | POA: Diagnosis not present

## 2022-12-18 DIAGNOSIS — B351 Tinea unguium: Secondary | ICD-10-CM | POA: Diagnosis not present

## 2022-12-18 DIAGNOSIS — L821 Other seborrheic keratosis: Secondary | ICD-10-CM | POA: Diagnosis not present

## 2022-12-18 DIAGNOSIS — L814 Other melanin hyperpigmentation: Secondary | ICD-10-CM | POA: Diagnosis not present

## 2022-12-18 DIAGNOSIS — L738 Other specified follicular disorders: Secondary | ICD-10-CM

## 2022-12-18 MED ORDER — METRONIDAZOLE 0.75 % EX CREA: TOPICAL_CREAM | CUTANEOUS | 11 refills | Status: AC

## 2022-12-18 MED ORDER — TAVABOROLE 5 % EX SOLN: CUTANEOUS | 5 refills | Status: AC

## 2022-12-18 NOTE — Patient Instructions (Addendum)
Seborrheic Keratosis  What causes seborrheic keratoses? Seborrheic keratoses are harmless, common skin growths that first appear during adult life.  As time goes by, more growths appear.  Some people may develop a large number of them.  Seborrheic keratoses appear on both covered and uncovered body parts.  They are not caused by sunlight.  The tendency to develop seborrheic keratoses can be inherited.  They vary in color from skin-colored to gray, brown, or even black.  They can be either smooth or have a rough, warty surface.   Seborrheic keratoses are superficial and look as if they were stuck on the skin.  Under the microscope this type of keratosis looks like layers upon layers of skin.  That is why at times the top layer may seem to fall off, but the rest of the growth remains and re-grows.    Treatment Seborrheic keratoses do not need to be treated, but can easily be removed in the office.  Seborrheic keratoses often cause symptoms when they rub on clothing or jewelry.  Lesions can be in the way of shaving.  If they become inflamed, they can cause itching, soreness, or burning.  Removal of a seborrheic keratosis can be accomplished by freezing, burning, or surgery. If any spot bleeds, scabs, or grows rapidly, please return to have it checked, as these can be an indication of a skin cancer.  Cryotherapy Aftercare  Wash gently with soap and water everyday.   Apply Vaseline and Band-Aid daily until healed.       Melanoma ABCDEs  Melanoma is the most dangerous type of skin cancer, and is the leading cause of death from skin disease.  You are more likely to develop melanoma if you: Have light-colored skin, light-colored eyes, or red or blond hair Spend a lot of time in the sun Tan regularly, either outdoors or in a tanning bed Have had blistering sunburns, especially during childhood Have a close family member who has had a melanoma Have atypical moles or large birthmarks  Early  detection of melanoma is key since treatment is typically straightforward and cure rates are extremely high if we catch it early.   The first sign of melanoma is often a change in a mole or a new dark spot.  The ABCDE system is a way of remembering the signs of melanoma.  A for asymmetry:  The two halves do not match. B for border:  The edges of the growth are irregular. C for color:  A mixture of colors are present instead of an even brown color. D for diameter:  Melanomas are usually (but not always) greater than 23m - the size of a pencil eraser. E for evolution:  The spot keeps changing in size, shape, and color.  Please check your skin once per month between visits. You can use a small mirror in front and a large mirror behind you to keep an eye on the back side or your body.   If you see any new or changing lesions before your next follow-up, please call to schedule a visit.  Please continue daily skin protection including broad spectrum sunscreen SPF 30+ to sun-exposed areas, reapplying every 2 hours as needed when you're outdoors.   Staying in the shade or wearing long sleeves, sun glasses (UVA+UVB protection) and wide brim hats (4-inch brim around the entire circumference of the hat) are also recommended for sun protection.     Due to recent changes in healthcare laws, you may see results of  your pathology and/or laboratory studies on MyChart before the doctors have had a chance to review them. We understand that in some cases there may be results that are confusing or concerning to you. Please understand that not all results are received at the same time and often the doctors may need to interpret multiple results in order to provide you with the best plan of care or course of treatment. Therefore, we ask that you please give [place] 2 business days to thoroughly review all your results before contacting the office for clarification. Should we see a critical lab result, you will be contacted  sooner.   If You Need Anything After Your Visit  If you have any questions or concerns for your doctor, please call our main line at [phone] and press option 4 to reach your doctor's medical assistant. If no one answers, please leave a voicemail as directed and we will return your call as soon as possible. Messages left after 4 pm will be answered the following business day.   You may also send [place] a message via [place]. We typically respond to MyChart messages within 1-2 business days.  For prescription refills, please ask your pharmacy to contact our office. Our fax number is [phone].  If you have an urgent issue when the clinic is closed that cannot wait until the next business day, you can page your doctor at the number below.    Please note that while we do our best to be available for urgent issues outside of office hours, we are not available 24/7.   If you have an urgent issue and are unable to reach [place], you may choose to seek medical care at your doctor's office, retail clinic, urgent care center, or emergency room.  If you have a medical emergency, please immediately call 911 or go to the emergency department.  Pager Numbers  - Dr. Nehemiah Massed: [phone]  - Dr. Laurence Ferrari: [phone]  - Dr. Nicole Kindred: [phone]  In the event of inclement weather, please call our main line at [phone] for an update on the status of any delays or closures.  Dermatology Medication Tips: Please keep the boxes that topical medications come in in order to help keep track of the instructions about where and how to use these. Pharmacies typically print the medication instructions only on the boxes and not directly on the medication tubes.   If your medication is too expensive, please contact our office at [phone] option 4 or send [place] a message through [place].   We are unable to tell what your co-pay for medications will be in advance as this is different depending on your insurance  coverage. However, we may be able to find a substitute medication at lower cost or fill out paperwork to get insurance to cover a needed medication.   If a prior authorization is required to get your medication covered by your insurance company, please allow [place] 1-2 business days to complete this process.  Drug prices often vary depending on where the prescription is filled and some pharmacies may offer cheaper prices.  The website www.goodrx.com contains coupons for medications through different pharmacies. The prices here do not account for what the cost may be with help from insurance (it may be cheaper with your insurance), but the website can give you the price if you did not use any insurance.  - You can print the associated coupon and take it with your prescription to the pharmacy.  - You may also stop  by our office during regular business hours and pick up a GoodRx coupon card.  - If you need your prescription sent electronically to a different pharmacy, notify our office through [facility] or by phone at [phone] option 4.     Si Usted Necesita Algo Despus de Su Visita  Tambin puede enviarnos un mensaje a travs de Pharmacist, community. Por lo general respondemos a los mensajes de MyChart en el transcurso de 1 a 2 das hbiles.  Para renovar recetas, por favor pida a su farmacia que se ponga en contacto con nuestra oficina. Harland Dingwall de fax es [place] [phone].  Si tiene un asunto urgente cuando la clnica est cerrada y que no puede esperar hasta el siguiente da hbil, puede llamar/localizar a su doctor(a) al nmero que aparece a continuacin.   Por favor, tenga en cuenta que aunque hacemos todo lo posible para estar disponibles para asuntos urgentes fuera del horario de [place], no estamos disponibles las 24 horas del da, los 7 das de la [place].   Si tiene un problema urgente y no puede comunicarse con nosotros, puede optar por buscar atencin mdica  en el consultorio de  su doctor(a), en una clnica privada, en un centro de atencin urgente o en una sala de emergencias.  Si tiene Engineering geologist, por favor llame inmediatamente al 911 o vaya a la sala de emergencias.  Nmeros de bper  - Dr. Nehemiah Massed: [phone]  - Dra. Moye: [phone]  - Dra. Nicole Kindred: [phone]  En caso de inclemencias del [place], por favor llame a Johnsie Kindred principal al [phone] para una actualizacin sobre el [place] de cualquier retraso o cierre.  Consejos para la medicacin en dermatologa: Por favor, guarde las cajas en las que vienen los medicamentos de uso tpico para ayudarle a seguir las instrucciones sobre dnde y cmo usarlos. Las farmacias generalmente imprimen las instrucciones del medicamento slo en las cajas y no directamente en los tubos del [place].   Si su medicamento es muy caro, por favor, pngase en contacto con Zigmund Daniel llamando al [phone] y presione la opcin 4 o envenos un mensaje a travs de Pharmacist, community.   No podemos decirle cul ser su copago por los medicamentos por adelantado ya que esto es diferente dependiendo de la cobertura de su seguro. Sin embargo, es posible que podamos encontrar un medicamento sustituto a Electrical engineer un formulario para que el seguro cubra el medicamento que se considera necesario.   Si se requiere una autorizacin previa para que su compaa de seguros [place] su medicamento, por favor permtanos de 1 a 2 das hbiles para completar este proceso.  Los precios de los medicamentos varan con frecuencia dependiendo del Environmental consultant de dnde se surte la receta y alguna farmacias pueden ofrecer precios ms baratos.  El sitio web www.goodrx.com tiene cupones para medicamentos de Airline pilot. Los precios aqu no tienen en cuenta lo que podra costar con la ayuda del seguro (puede ser ms barato con su seguro), pero el sitio web puede darle el precio si no utiliz Research scientist (physical sciences).  - Puede imprimir el  cupn correspondiente y llevarlo con su receta a la farmacia.  - Tambin puede pasar por nuestra oficina durante el horario de atencin regular y Charity fundraiser una tarjeta de cupones de GoodRx.  - Si necesita que su receta se enve electrnicamente a una farmacia diferente, informe a nuestra oficina a travs de MyChart de [place] o por telfono llamando al [phone] y presione la opcin 4.

## 2022-12-18 NOTE — Progress Notes (Signed)
Follow-Up Visit   Subjective  Natasha Hawkins is a 62 y.o. female who presents for the following: Annual Exam (1 year tbse, hx of aks, sk, spots left lower leg, ). Recheck toenails- on Kerydin.  Spot on leg is new and growing.  The patient presents for Total-Body Skin Exam (TBSE) for skin cancer screening and mole check.  The patient has spots, moles and lesions to be evaluated, some may be new or changing and the patient has concerns that these could be cancer.   The following portions of the chart were reviewed this encounter and updated as appropriate:      Review of Systems: No other skin or systemic complaints except as noted in HPI or Assessment and Plan.   Objective  Well appearing patient in no apparent distress; mood and affect are within normal limits.  A full examination was performed including scalp, head, eyes, ears, nose, lips, neck, chest, axillae, abdomen, back, buttocks, bilateral upper extremities, bilateral lower extremities, hands, feet, fingers, toes, fingernails, and toenails. All findings within normal limits unless otherwise noted below.  face Mild erythema with telangectasia at malar cheeks and nose   left great toenail Distal yellow brown discoloration with onycholysis        Left Dorsal Hand 7 mm waxy gray brown speckled macule   left upper pretibia 1.3 cm waxy tan patch   left lateral lower leg x1 Erythematous stuck-on, waxy papule   Assessment & Plan  Rosacea face  With mild flare.  Chronic and persistent condition with duration or expected duration over one year. Condition is symptomatic/ bothersome to patient. Not currently at goal.   Rosacea is a chronic progressive skin condition usually affecting the face of adults, causing redness and/or acne bumps. It is treatable but not curable. It sometimes affects the eyes (ocular rosacea) as well. It may respond to topical and/or systemic medication and can flare with stress, sun exposure,  alcohol, exercise and some foods.  Daily application of broad spectrum spf 30+ sunscreen to face is recommended to reduce flares.   Continue metronidazole 0.75% cream Apply to face QD/BID for rosacea dsp 45g   Continue with  daily broad spectrum sunscreen SPF 30+ to sun-exposed areas, reapply every 2 hours as needed. Call for new or changing lesions.  Staying in the shade or wearing long sleeves, sun glasses (UVA+UVB protection) and wide brim hats (4-inch brim around the entire circumference of the hat) are also recommended for sun protection.   metroNIDAZOLE (METROCREAM) 0.75 % cream - face Apply to cheeks and nose every night for rosacea, twice a day if flared.  Tinea unguium left great toenail  Chronic and persistent condition with duration or expected duration over one year. Condition is symptomatic/ bothersome to patient. Some improvement compared with prior photos but not currently at goal.   Discussed oral antifungal treatments - patient defers  Continue  Kerydin solution Apply to toenail every night dsp 92m  [facility] [place] - left great toenail Apply to toenail every night until improved.  Seborrheic keratosis (2) left upper pretibia; Left Dorsal Hand  Reassured benign age-related growth.  Recommend observation.  Discussed cryotherapy if spot(s) become irritated or inflamed.  Inflamed seborrheic keratosis left lateral lower leg x1  Vs wart at left lateral lower leg  Symptomatic, irritating, patient would like treated.  Destruction of lesion - left lateral lower leg x1  Destruction method: cryotherapy   Informed consent: discussed and  consent obtained   Lesion destroyed using liquid nitrogen: Yes   Region frozen until ice ball extended beyond lesion: Yes   Outcome: patient tolerated procedure well with no complications   Post-procedure details: wound care instructions given   Additional details:  Prior to procedure,  discussed risks of blister formation, small wound, skin dyspigmentation, or rare scar following cryotherapy. Recommend Vaseline ointment to treated areas while healing.   Lentigines - Scattered tan macules - Due to sun exposure - Benign-appearing, observe - Recommend daily broad spectrum sunscreen SPF 30+ to sun-exposed areas, reapply every 2 hours as needed. - Call for any changes  Sebaceous Hyperplasia - Small yellow papules with a central dell - Benign - Observe  Seborrheic Keratoses - Stuck-on, waxy, tan-brown papules and/or plaques  - Benign-appearing - Discussed benign etiology and prognosis. - Observe - Call for any changes  Melanocytic Nevi - Tan-brown and/or pink-flesh-colored symmetric macules and papules - Benign appearing on exam today - Observation - Call clinic for new or changing moles - Recommend daily use of broad spectrum spf 30+ sunscreen to sun-exposed areas.   Hemangiomas - Red papules - Discussed benign nature - Observe - Call for any changes  Actinic Damage - Chronic condition, secondary to cumulative UV/sun exposure - diffuse scaly erythematous macules with underlying dyspigmentation - Recommend daily broad spectrum sunscreen SPF 30+ to sun-exposed areas, reapply every 2 hours as needed.  - Staying in the shade or wearing long sleeves, sun glasses (UVA+UVB protection) and wide brim hats (4-inch brim around the entire circumference of the hat) are also recommended for sun protection.  - Call for new or changing lesions.  Skin cancer screening performed today. Return in about 1 year (around 12/19/2023) for TBSE.  I, Ruthell Rummage, CMA, am acting as scribe for Brendolyn Patty, MD.  Documentation: I have reviewed the above documentation for accuracy and completeness, and I agree with the above.  Brendolyn Patty MD

## 2023-01-25 ENCOUNTER — Other Ambulatory Visit: Payer: Self-pay | Admitting: Family Medicine

## 2023-01-25 DIAGNOSIS — Z1231 Encounter for screening mammogram for malignant neoplasm of breast: Secondary | ICD-10-CM

## 2023-01-26 ENCOUNTER — Ambulatory Visit
Admission: RE | Admit: 2023-01-26 | Discharge: 2023-01-26 | Disposition: A | Discharge status: Home / Self Care | Payer: 59 | Source: Ambulatory Visit | Attending: Family Medicine | Admitting: Family Medicine

## 2023-01-26 DIAGNOSIS — Z1231 Encounter for screening mammogram for malignant neoplasm of breast: Secondary | ICD-10-CM

## 2023-02-06 NOTE — Progress Notes (Signed)
62 y.o. G0P0000 Married White or Caucasian Not Hispanic or Latino female here for annual exam. No vaginal bleeding. Sexually active, uses lidocaine and lubricant which makes it okay.  No bowel or bladder issues.     No LMP recorded. Patient is postmenopausal.          Sexually active: Yes.    The current method of family planning is post menopausal status.    Exercising: Yes.     Walking and dancing  Smoker:  no  Health Maintenance: Pap:  12/30/20 WNL Hr HPV Neg. 10-16-16 WNL NEG HR HPV  History of abnormal Pap:  yes years ago not surgery on her cervix.  MMG:  01/29/23 Density c Bi-rads 1 neg  BMD:   06/15/22 osteopenic, FRAX 13/1.2% Colonoscopy: 08/03/20 f/u 5 years TDaP:  09/26/12 Gardasil: n/a   reports that she has never smoked. She has never used smokeless tobacco. She reports that she does not currently use alcohol. She reports that she does not use drugs. 2-3 glasses of wine a week. Retired.   Past Medical History:  Diagnosis Date   Biliary dyskinesia    per pt told and dx 1990s   Endometriosis    Heart murmur    refer to pcp dr pickard, note in epic 05-16-2021 stated in assessment no murmur heard// (02-24-2022 )per pt was told had MVP approx early 2000s or late 1990s, stated had echo done but has not had one since   Hyperlipidemia    Hypothyroidism    followed by pcp   Internal hemorrhoids    Osteopenia    PMB (postmenopausal bleeding)    Rosacea    Thickened endometrium    Wears contact lenses     Past Surgical History:  Procedure Laterality Date   APPENDECTOMY     child   COLONOSCOPY WITH PROPOFOL N/A 05/28/2015   Procedure: COLONOSCOPY WITH PROPOFOL;  Surgeon: Scot Jun, MD;  Location: [facility] ENDOSCOPY;  Service: Endoscopy;  Laterality: N/A;   COLONOSCOPY WITH PROPOFOL N/A 08/03/2020   Procedure: COLONOSCOPY WITH PROPOFOL;  Surgeon: Midge Minium, MD;  Location[facility] ENDOSCOPY;  Service: Endoscopy;  Laterality: N/A;   DILATATION & CURETTAGE/HYSTEROSCOPY WITH  MYOSURE N/A 02/28/2022   Procedure: DILATATION & CURETTAGE/HYSTEROSCOPY WITH MYOSURE;  Surgeon: Romualdo Bolk, MD;  [facility] [place];  Service: Gynecology;  Laterality: N/A;   LAPAROSCOPIC SALPINGO OOPHERECTOMY Left 2002   ORIF WRIST FRACTURE Left 2012    Current Outpatient Medications  Medication Sig Dispense Refill   Calcium-Vitamin D (CALTRATE 600 PLUS-VIT D PO) Take by mouth daily. 2 chews     levothyroxine (SYNTHROID) 75 MCG tablet Take 1 tablet (75 mcg total) by mouth daily. 90 tablet 3   lidocaine (XYLOCAINE) 5 % ointment Apply a pea sized amount topically 20 minutes prior to intercourse, then wipe it off just prior to intercourse 30 g 1   metroNIDAZOLE (METROCREAM) 0.75 % cream Apply to cheeks and nose every night for rosacea, twice a day if flared. 45 g 11   pravastatin (PRAVACHOL) 20 MG tablet Take 1 tablet (20 mg total) by mouth daily. 90 tablet 3   Tavaborole (KERYDIN) 5 % SOLN Apply to toenail every night until improved. 10 mL 5   No current facility-administered medications for this visit.    Family History  Problem Relation Age of Onset   Cancer Mother        lung   Intracerebral hemorrhage Father    Heart disease Father  Diabetes Father    Kidney disease Father    Osteoporosis Sister 5   Cancer Maternal Aunt        colon   Osteoporosis Maternal Aunt    Cancer Maternal Aunt        colon    Pancreatic cancer Paternal Aunt    Ovarian cancer Paternal Aunt 29   Pancreatic cancer Paternal Uncle    Heart disease Maternal Grandfather    Cancer Paternal Grandmother    Breast cancer Neg Hx     Review of Systems  All other systems reviewed and are negative.   Exam:   BP 128/64   Pulse 68   Ht 5' 1.75" (1.568 m)   Wt 121 lb (54.9 kg)   SpO2 100%   BMI 22.31 kg/m   Weight change: @ Height:   Height: 5' 1.75" (156.8 cm)  Ht Readings from Last 3 Encounters:  02/20/23 5' 1.75" (1.568 m)  05/18/22  (1.575 m)   03/08/22  (1.575 m)    General appearance: alert, cooperative and appears stated age Head: Normocephalic, without obvious abnormality, atraumatic Neck: no adenopathy, supple, symmetrical, trachea midline and thyroid normal to inspection and palpation Lungs: clear to auscultation bilaterally Cardiovascular: regular rate and rhythm Breasts: normal appearance, no masses or tenderness Abdomen: soft, non-tender; non distended,  no masses,  no organomegaly Extremities: extremities normal, atraumatic, no cyanosis or edema Skin: Skin color, texture, turgor normal. No rashes or lesions Lymph nodes: Cervical, supraclavicular, and axillary nodes normal. No abnormal inguinal nodes palpated Neurologic: Grossly normal   Pelvic: External genitalia:  no lesions              Urethra:  normal appearing urethra with no masses, tenderness or lesions              Bartholins and Skenes: normal                 Vagina: atrophic appearing vagina. Able to feel a thin adhesion between the anterior and posterior vaginal walls a few cm distal from the cervix, unable to see it with the speculum.               Cervix: no lesions               Bimanual Exam:  Uterus:  normal size, contour, position, consistency, mobility, non-tender              Adnexa: no mass, fullness, tenderness               Rectovaginal: Confirms               Anus:  normal sphincter tone, no lesions  [name], CMA chaperoned for the exam.  1. Well woman exam Discussed breast self exam Mammogram and colonoscopy UTD Labs with primary   2. Osteopenia, unspecified location Dexa due in 8/25 Discussed calcium and vit D intake  3. Dyspareunia in female Declines vaginal estrogen - lidocaine (XYLOCAINE) 5 % ointment; Apply a pea sized amount topically 20 minutes prior to intercourse, then wipe it off just prior to intercourse  Dispense: 30 g; Refill: 1  4. Immunization due - Tdap vaccine greater than or equal to 7yo  IM  Addendum: Thin adhesion palpated between the anterior and posterior vaginal wall, not seen with the speculum. Suspect it could tear with intercourse. She was advised she should come in with any bleeding with intercourse.

## 2023-02-20 ENCOUNTER — Encounter: Payer: Self-pay | Admitting: Obstetrics and Gynecology

## 2023-02-20 ENCOUNTER — Ambulatory Visit (INDEPENDENT_AMBULATORY_CARE_PROVIDER_SITE_OTHER): Payer: 59 | Admitting: Obstetrics and Gynecology

## 2023-02-20 VITALS — BP 128/64 | HR 68 | Ht 61.75 in | Wt 121.0 lb

## 2023-02-20 DIAGNOSIS — N941 Unspecified dyspareunia: Secondary | ICD-10-CM | POA: Diagnosis not present

## 2023-02-20 DIAGNOSIS — M8580 Other specified disorders of bone density and structure, unspecified site: Secondary | ICD-10-CM | POA: Diagnosis not present

## 2023-02-20 DIAGNOSIS — Z23 Encounter for immunization: Secondary | ICD-10-CM

## 2023-02-20 DIAGNOSIS — Z01419 Encounter for gynecological examination (general) (routine) without abnormal findings: Secondary | ICD-10-CM

## 2023-02-20 MED ORDER — LIDOCAINE 5 % EX OINT: TOPICAL_OINTMENT | CUTANEOUS | 1 refills | Status: DC

## 2023-02-20 NOTE — Patient Instructions (Signed)

## 2023-04-24 ENCOUNTER — Other Ambulatory Visit: Payer: Self-pay | Admitting: Family Medicine

## 2023-04-24 NOTE — Telephone Encounter (Signed)
Requested Prescriptions  Pending Prescriptions Disp Refills   pravastatin (PRAVACHOL) 20 MG tablet [Pharmacy Med Name: PRAVASTATIN SODIUM 20 MG TAB] 90 tablet 0    Sig: TAKE 1 TABLET BY MOUTH EVERY DAY     Cardiovascular:  Antilipid - Statins Failed - 04/24/2023  2:23 AM      Failed - Valid encounter within last 12 months    Recent Outpatient Visits           1 year ago Routine general medical examination at a health care facility   [facility] Medicine Pickard, Priscille Heidelberg, MD   2 years ago Colon cancer screening   [facility] Donita Brooks, MD   3 years ago Postmenopausal estrogen deficiency   [facility] Medicine Tanya Nones, Priscille Heidelberg, MD   5 years ago Routine general medical examination at a health care facility   [facility] Medicine Pickard, Priscille Heidelberg, MD   6 years ago Routine general medical examination at a health care facility   [facility] Medicine Pickard, Priscille Heidelberg, MD       Future Appointments             In 3 weeks Tanya Nones, Priscille Heidelberg, MD  [facility], [facility]            Failed - Lipid Panel in normal range within the last 12 months    Cholesterol  Date Value Ref Range Status  07/04/2022 191 <200 mg/dL Final   LDL Cholesterol (Calc)  Date Value Ref Range Status  07/04/2022 94 mg/dL (calc) Final    Comment:    Reference range: <100 . Desirable range <100 mg/dL for primary prevention;   <70 mg/dL for patients with CHD or diabetic patients  with > or = 2 CHD risk factors. Marland Kitchen LDL-C is now calculated using the Martin-Hopkins  calculation, which is a validated novel method providing  better accuracy than the Friedewald equation in the  estimation of LDL-C.  Horald Pollen et al. Lenox Ahr. 1914;782(95): 2061-2068  (http://education.[facility].com/faq/FAQ164)    HDL  Date Value Ref Range Status  07/04/2022 84 > OR = 50 mg/dL Final   Triglycerides  Date Value Ref Range Status  07/04/2022 52  <150 mg/dL Final         Passed - Patient is not pregnant       levothyroxine (SYNTHROID) 75 MCG tablet [Pharmacy Med Name: LEVOTHYROXINE 75 MCG TABLET] 90 tablet 0    Sig: TAKE 1 TABLET BY MOUTH EVERY DAY     Endocrinology:  Hypothyroid Agents Failed - 04/24/2023  2:23 AM      Failed - Valid encounter within last 12 months    Recent Outpatient Visits           1 year ago Routine general medical examination at a health care facility   [facility] Medicine Pickard, Priscille Heidelberg, MD   2 years ago Colon cancer screening   [facility] Donita Brooks, MD   3 years ago Postmenopausal estrogen deficiency   [facility] Medicine Tanya Nones, Priscille Heidelberg, MD   5 years ago Routine general medical examination at a health care facility   [facility] Medicine Pickard, Priscille Heidelberg, MD   6 years ago Routine general medical examination at a health care facility   [facility] Medicine Pickard, Priscille Heidelberg, MD       Future Appointments  In 3 weeks Pickard, Priscille Heidelberg, MD [facility]            Passed - TSH in normal range and within 360 days    TSH  Date Value Ref Range Status  07/04/2022 1.12 0.40 - 4.50 mIU/L Final

## 2023-05-17 ENCOUNTER — Other Ambulatory Visit: Payer: 59

## 2023-05-17 DIAGNOSIS — E7800 Pure hypercholesterolemia, unspecified: Secondary | ICD-10-CM | POA: Diagnosis not present

## 2023-05-17 DIAGNOSIS — E038 Other specified hypothyroidism: Secondary | ICD-10-CM | POA: Diagnosis not present

## 2023-05-17 DIAGNOSIS — E039 Hypothyroidism, unspecified: Secondary | ICD-10-CM

## 2023-05-17 DIAGNOSIS — K828 Other specified diseases of gallbladder: Secondary | ICD-10-CM | POA: Diagnosis not present

## 2023-05-18 LAB — COMPLETE METABOLIC PANEL WITH GFR
AG Ratio: 1.8 (calc) (ref 1.0–2.5)
ALT: 11 U/L (ref 6–29)
AST: 16 U/L (ref 10–35)
Albumin: 4.7 g/dL (ref 3.6–5.1)
Alkaline phosphatase (APISO): 106 U/L (ref 37–153)
BUN: 13 mg/dL (ref 7–25)
CO2: 26 mmol/L (ref 20–32)
Calcium: 10.1 mg/dL (ref 8.6–10.4)
Chloride: 105 mmol/L (ref 98–110)
Creat: 0.99 mg/dL (ref 0.50–1.05)
Globulin: 2.6 g/dL (calc) (ref 1.9–3.7)
Glucose, Bld: 99 mg/dL (ref 65–99)
Potassium: 4.6 mmol/L (ref 3.5–5.3)
Sodium: 141 mmol/L (ref 135–146)
Total Bilirubin: 0.8 mg/dL (ref 0.2–1.2)
Total Protein: 7.3 g/dL (ref 6.1–8.1)
eGFR: 64 mL/min/{1.73_m2} (ref >=60)

## 2023-05-18 LAB — CBC WITH DIFFERENTIAL/PLATELET
Absolute Monocytes: 503 cells/uL (ref 200–950)
Basophils Absolute: 38 cells/uL (ref 0–200)
Basophils Relative: 0.5 %
Eosinophils Absolute: 233 cells/uL (ref 15–500)
Eosinophils Relative: 3.1 %
HCT: 45.3 % — ABNORMAL HIGH (ref 35.0–45.0)
Hemoglobin: 15.3 g/dL (ref 11.7–15.5)
Lymphs Abs: 2100 cells/uL (ref 850–3900)
MCH: 28.3 pg (ref 27.0–33.0)
MCHC: 33.8 g/dL (ref 32.0–36.0)
MCV: 83.9 fL (ref 80.0–100.0)
MPV: 9.7 fL (ref 7.5–12.5)
Monocytes Relative: 6.7 %
Neutro Abs: 4628 cells/uL (ref 1500–7800)
Neutrophils Relative %: 61.7 %
Platelets: 362 10*3/uL (ref 140–400)
RBC: 5.4 10*6/uL — ABNORMAL HIGH (ref 3.80–5.10)
RDW: 13 % (ref 11.0–15.0)
Total Lymphocyte: 28 %
WBC: 7.5 10*3/uL (ref 3.8–10.8)

## 2023-05-18 LAB — LIPID PANEL
Cholesterol: 186 mg/dL (ref <200)
HDL: 74 mg/dL (ref >=50)
LDL Cholesterol (Calc): 98 mg/dL (calc)
Non-HDL Cholesterol (Calc): 112 mg/dL (calc) (ref <130)
Total CHOL/HDL Ratio: 2.5 (calc) (ref <5.0)
Triglycerides: 56 mg/dL (ref <150)

## 2023-05-18 LAB — TSH: TSH: 0.32 mIU/L — ABNORMAL LOW (ref 0.40–4.50)

## 2023-05-21 ENCOUNTER — Ambulatory Visit (INDEPENDENT_AMBULATORY_CARE_PROVIDER_SITE_OTHER): Payer: 59 | Admitting: Family Medicine

## 2023-05-21 ENCOUNTER — Other Ambulatory Visit: Payer: Self-pay | Admitting: Family Medicine

## 2023-05-21 ENCOUNTER — Encounter: Payer: Self-pay | Admitting: Family Medicine

## 2023-05-21 VITALS — BP 112/68 | HR 76 | Temp 98.8°F | Ht 61.75 in | Wt 120.4 lb

## 2023-05-21 DIAGNOSIS — E7800 Pure hypercholesterolemia, unspecified: Secondary | ICD-10-CM

## 2023-05-21 DIAGNOSIS — Z0000 Encounter for general adult medical examination without abnormal findings: Secondary | ICD-10-CM

## 2023-05-21 DIAGNOSIS — N182 Chronic kidney disease, stage 2 (mild): Secondary | ICD-10-CM | POA: Diagnosis not present

## 2023-05-21 DIAGNOSIS — E038 Other specified hypothyroidism: Secondary | ICD-10-CM

## 2023-05-21 DIAGNOSIS — Z0001 Encounter for general adult medical examination with abnormal findings: Secondary | ICD-10-CM

## 2023-05-21 MED ORDER — LEVOTHYROXINE SODIUM 62.5 MCG PO CAPS
1.0000 | ORAL_CAPSULE | Freq: Every day | ORAL | 3 refills | Status: DC
Start: 2023-05-21 — End: 2023-05-22

## 2023-05-21 NOTE — Progress Notes (Signed)
Subjective:    Patient ID: Natasha Hawkins, female    DOB: [birth year], 62 y.o.   MRN: [id number]  HPI Patient is a 62 year old Caucasian female here today for complete physical exam.  Patient had mammogram in March that was normal.  Her last colonoscopy was in 2021 and they recommended repeating that again in 2026.  Her gynecologist performed a hysteroscopy last year due to postmenopausal bleeding which was normal.  Her Pap smears done through her gynecologist. Immunization History  Administered Date(s) Administered   Influenza Split 09/06/2013   PFIZER(Purple Top)SARS-COV-2 Vaccination 06/17/2020, 07/08/2020   Td 06/02/1992   Tdap 09/26/2012, 02/20/2023   Zoster Recombinant(Shingrix) 07/20/2022, 11/22/2022    Past Medical History:  Diagnosis Date   Biliary dyskinesia    per pt told and dx 1990s   Endometriosis    Heart murmur    refer to pcp dr Sheniqua Carolan, note in epic 05-16-2021 stated in assessment no murmur heard// (02-24-2022 )per pt was told had MVP approx early 2000s or late 1990s, stated had echo done but has not had one since   Hyperlipidemia    Hypothyroidism    followed by pcp   Internal hemorrhoids    Osteopenia    PMB (postmenopausal bleeding)    Rosacea    Thickened endometrium    Wears contact lenses    Past Surgical History:  Procedure Laterality Date   APPENDECTOMY     child   COLONOSCOPY WITH PROPOFOL N/A 05/28/2015   Procedure: COLONOSCOPY WITH PROPOFOL;  Surgeon: Scot Jun, MD;  Location: [facility] ENDOSCOPY;  Service: Endoscopy;  Laterality: N/A;   COLONOSCOPY WITH PROPOFOL N/A 08/03/2020   Procedure: COLONOSCOPY WITH PROPOFOL;  Surgeon: Midge Minium, MD;  Location: [facility] ENDOSCOPY;  Service: Endoscopy;  Laterality: N/A;   DILATATION & CURETTAGE/HYSTEROSCOPY WITH MYOSURE N/A 02/28/2022   Procedure: DILATATION & CURETTAGE/HYSTEROSCOPY WITH MYOSURE;  Surgeon: Romualdo Bolk, MD;  Location: [facility];  Service: Gynecology;  Laterality:  N/A;   LAPAROSCOPIC SALPINGO OOPHERECTOMY Left 2002   ORIF WRIST FRACTURE Left 2012   Current Outpatient Medications on File Prior to Visit  Medication Sig Dispense Refill   Calcium-Vitamin D (CALTRATE 600 PLUS-VIT D PO) Take by mouth daily. 2 chews     lidocaine (XYLOCAINE) 5 % ointment Apply a pea sized amount topically 20 minutes prior to intercourse, then wipe it off just prior to intercourse 30 g 1   metroNIDAZOLE (METROCREAM) 0.75 % cream Apply to cheeks and nose every night for rosacea, twice a day if flared. 45 g 11   pravastatin (PRAVACHOL) 20 MG tablet TAKE 1 TABLET BY MOUTH EVERY DAY 90 tablet 0   Tavaborole (KERYDIN) 5 % SOLN Apply to toenail every night until improved. 10 mL 5   No current facility-administered medications on file prior to visit.   No Known Allergies Social History   Socioeconomic History   Marital status: Married    Spouse name: Not on file   Number of children: Not on file   Years of education: Not on file   Highest education level: Not on file  Occupational History   Not on file  Tobacco Use   Smoking status: Never   Smokeless tobacco: Never  Vaping Use   Vaping status: Never Used  Substance and Sexual Activity   Alcohol use: Not Currently    Comment: very rare   Drug use: Never   Sexual activity: Yes    Partners: Male    Birth control/protection: Post-menopausal  Other Topics Concern   Not on file  Social History Narrative   Not on file   Social Determinants of Health   Financial Resource Strain: Not on file  Food Insecurity: Not on file  Transportation Needs: Not on file  Physical Activity: Not on file  Stress: Not on file  Social Connections: Not on file  Intimate Partner Violence: Not on file   Family History  Problem Relation Age of Onset   Cancer Mother        lung   Intracerebral hemorrhage Father    Heart disease Father    Diabetes Father    Kidney disease Father    Osteoporosis Sister 61   Cancer Maternal Aunt         colon   Osteoporosis Maternal Aunt    Cancer Maternal Aunt        colon    Pancreatic cancer Paternal Aunt    Ovarian cancer Paternal Aunt 44   Pancreatic cancer Paternal Uncle    Heart disease Maternal Grandfather    Cancer Paternal Grandmother    Breast cancer Neg Hx       Review of Systems     Objective:   Physical Exam Vitals reviewed.  Constitutional:      General: She is not in acute distress.    Appearance: Normal appearance. She is normal weight. She is not ill-appearing, toxic-appearing or diaphoretic.  HENT:     Head: Normocephalic and atraumatic.     Right Ear: Tympanic membrane, ear canal and external ear normal. There is no impacted cerumen.     Left Ear: Tympanic membrane, ear canal and external ear normal. There is no impacted cerumen.     Nose: Nose normal. No congestion.     Mouth/Throat:     Mouth: Mucous membranes are moist.     Pharynx: Oropharynx is clear. No oropharyngeal exudate or posterior oropharyngeal erythema.  Eyes:     General:        Right eye: No discharge.        Left eye: No discharge.     Extraocular Movements: Extraocular movements intact.     Conjunctiva/sclera: Conjunctivae normal.     Pupils: Pupils are equal, round, and reactive to light.  Neck:     Vascular: No carotid bruit.  Cardiovascular:     Rate and Rhythm: Normal rate and regular rhythm.     Pulses: Normal pulses.     Heart sounds: Normal heart sounds. No murmur heard.    No friction rub. No gallop.  Pulmonary:     Effort: Pulmonary effort is normal. No respiratory distress.     Breath sounds: Normal breath sounds. No stridor. No wheezing, rhonchi or rales.  Abdominal:     General: Abdomen is flat. Bowel sounds are normal. There is no distension.     Palpations: Abdomen is soft. There is no mass.     Tenderness: There is no abdominal tenderness. There is no guarding.     Hernia: No hernia is present.  Musculoskeletal:        General: No swelling or deformity.  Normal range of motion.     Cervical back: Normal range of motion and neck supple. No rigidity.     Right lower leg: No edema.     Left lower leg: No edema.  Lymphadenopathy:     Cervical: No cervical adenopathy.  Skin:    General: Skin is warm.     Coloration: Skin is not jaundiced.  Findings: No bruising, erythema, lesion or rash.  Neurological:     General: No focal deficit present.     Mental Status: She is alert and oriented to person, place, and time. Mental status is at baseline.     Cranial Nerves: No cranial nerve deficit.     Sensory: No sensory deficit.     Motor: No weakness.     Coordination: Coordination normal.     Gait: Gait normal.     Deep Tendon Reflexes: Reflexes normal.    Lab on 05/17/2023  Component Date Value Ref Range Status   WBC 05/17/2023 7.5  3.8 - 10.8 Thousand/uL Final   RBC 05/17/2023 5.40 (H)  3.80 - 5.10 Million/uL Final   Hemoglobin 05/17/2023 15.3  11.7 - 15.5 g/dL Final   HCT 11/91/4782 45.3 (H)  35.0 - 45.0 % Final   MCV 05/17/2023 83.9  80.0 - 100.0 fL Final   MCH 05/17/2023 28.3  27.0 - 33.0 pg Final   MCHC 05/17/2023 33.8  32.0 - 36.0 g/dL Final   RDW 95/62/1308 13.0  11.0 - 15.0 % Final   Platelets 05/17/2023 362  140 - 400 Thousand/uL Final   MPV 05/17/2023 9.7  7.5 - 12.5 fL Final   Neutro Abs 05/17/2023 4,628  1,500 - 7,800 cells/uL Final   Lymphs Abs 05/17/2023 2,100  850 - 3,900 cells/uL Final   Absolute Monocytes 05/17/2023 503  200 - 950 cells/uL Final   Eosinophils Absolute 05/17/2023 233  15 - 500 cells/uL Final   Basophils Absolute 05/17/2023 38  0 - 200 cells/uL Final   Neutrophils Relative % 05/17/2023 61.7  % Final   Total Lymphocyte 05/17/2023 28.0  % Final   Monocytes Relative 05/17/2023 6.7  % Final   Eosinophils Relative 05/17/2023 3.1  % Final   Basophils Relative 05/17/2023 0.5  % Final   Glucose, Bld 05/17/2023 99  65 - 99 mg/dL Final   Comment: .            Fasting reference interval .    BUN 05/17/2023  13  7 - 25 mg/dL Final   Creat 65/78/4696 0.99  0.50 - 1.05 mg/dL Final   eGFR 29/52/8413 64  > OR = 60 mL/min/1.29m2 Final   BUN/Creatinine Ratio 05/17/2023 SEE NOTE:  6 - 22 (calc) Final   Comment:    Not Reported: BUN and Creatinine are within    reference range. .    Sodium 05/17/2023 141  135 - 146 mmol/L Final   Potassium 05/17/2023 4.6  3.5 - 5.3 mmol/L Final   Chloride 05/17/2023 105  98 - 110 mmol/L Final   CO2 05/17/2023 26  20 - 32 mmol/L Final   Calcium 05/17/2023 10.1  8.6 - 10.4 mg/dL Final   Total Protein 24/40/1027 7.3  6.1 - 8.1 g/dL Final   Albumin 25/36/6440 4.7  3.6 - 5.1 g/dL Final   Globulin 34/74/2595 2.6  1.9 - 3.7 g/dL (calc) Final   AG Ratio 05/17/2023 1.8  1.0 - 2.5 (calc) Final   Total Bilirubin 05/17/2023 0.8  0.2 - 1.2 mg/dL Final   Alkaline phosphatase (APISO) 05/17/2023 106  37 - 153 U/L Final   AST 05/17/2023 16  10 - 35 U/L Final   ALT 05/17/2023 11  6 - 29 U/L Final   Cholesterol 05/17/2023 186  <200 mg/dL Final   HDL 63/87/5643 74  > OR = 50 mg/dL Final   Triglycerides 32/95/1884 56  <150 mg/dL Final   LDL  Cholesterol (Calc) 05/17/2023 98  mg/dL (calc) Final   Comment: Reference range: <100 . Desirable range <100 mg/dL for primary prevention;   <70 mg/dL for patients with CHD or diabetic patients  with > or = 2 CHD risk factors. Marland Kitchen LDL-C is now calculated using the Martin-Hopkins  calculation, which is a validated novel method providing  better accuracy than the Friedewald equation in the  estimation of LDL-C.  Horald Pollen et al. Lenox Ahr. 9629;528(41): 2061-2068  (http://education.[facility].com/faq/FAQ164)    Total CHOL/HDL Ratio 05/17/2023 2.5  <3.2 (calc) Final   Non-HDL Cholesterol (Calc) 05/17/2023 112  <130 mg/dL (calc) Final   Comment: For patients with diabetes plus 1 major ASCVD risk  factor, treating to a non-HDL-C goal of <100 mg/dL  (LDL-C of <44 mg/dL) is considered a therapeutic  option.    TSH 05/17/2023 0.32 (L)  0.40 -  4.50 mIU/L Final       Assessment & Plan:  Stage 2 chronic kidney disease - Plan: Urinalysis, Routine w reflex microscopic, Protein / Creatinine Ratio, Urine  Routine general medical examination at a health care facility  Other specified hypothyroidism  Pure hypercholesterolemia Her blood pressure today is outstanding.  Her cholesterol is excellent.  I am extremely happy with her lab work.  Her TSH is slightly low at 0.32 so I recommended reducing her levothyroxine dose to 62.5 mcg daily.  Her creatinine calculates a GFR of 64.  This gives her mild stage II chronic kidney disease.  She is not using NSAIDs.  She states that she may not be drinking enough water.  I would like to get a urinalysis today to check evidence of hematuria or proteinuria.  I encouraged her to drink more water

## 2023-05-22 ENCOUNTER — Other Ambulatory Visit: Payer: 59

## 2023-05-22 DIAGNOSIS — N182 Chronic kidney disease, stage 2 (mild): Secondary | ICD-10-CM | POA: Diagnosis not present

## 2023-05-23 ENCOUNTER — Encounter: Payer: Self-pay | Admitting: Family Medicine

## 2023-05-23 LAB — URINALYSIS, ROUTINE W REFLEX MICROSCOPIC
Bilirubin Urine: NEGATIVE
Glucose, UA: NEGATIVE
Hgb urine dipstick: NEGATIVE
Ketones, ur: NEGATIVE
Leukocytes,Ua: NEGATIVE
Nitrite: NEGATIVE
Protein, ur: NEGATIVE
Specific Gravity, Urine: 1.007 (ref 1.001–1.035)
pH: 5.5 (ref 5.0–8.0)

## 2023-05-23 LAB — PROTEIN / CREATININE RATIO, URINE
Creatinine, Urine: 54 mg/dL (ref 20–275)
Protein/Creat Ratio: 74 mg/g creat (ref 24–184)
Protein/Creatinine Ratio: 0.074 mg/mg creat (ref 0.024–0.184)
Total Protein, Urine: 4 mg/dL — ABNORMAL LOW (ref 5–24)

## 2023-05-24 ENCOUNTER — Other Ambulatory Visit: Payer: Self-pay | Admitting: Family Medicine

## 2023-05-24 MED ORDER — LEVOTHYROXINE SODIUM 50 MCG PO TABS: 50.0000 ug | ORAL_TABLET | Freq: Every day | ORAL | 3 refills | Status: DC

## 2023-05-24 MED ORDER — LEVOTHYROXINE SODIUM 75 MCG PO TABS: 75.0000 ug | ORAL_TABLET | Freq: Every day | ORAL | 3 refills | Status: DC

## 2023-06-04 NOTE — Progress Notes (Signed)
GYNECOLOGY  VISIT   HPI: 62 y.o.   Married  Caucasian  female   [id number] with No LMP recorded. Patient is postmenopausal.   here for   pelvic pain and discharge. Last week, yellowy discharge/ light spotting and pain near the uterus when laying down.  Color was dark yellow/brown.  No sex or placing anything in the vagina.   No itching, burning or odor.   Had hysteroscopy 02/28/22 for postmenopausal bleeding and endometrial mass.  Final pathology benign polyp.   Has vaginal adhesion noted at her last exam on 02/20/23.   Uses lidocaine to introitus for pain with intercourse.  Not active very often. No partner change.  Not using any hormonal treatments.   No rectal bleeding.   GYNECOLOGIC HISTORY: No LMP recorded. Patient is postmenopausal. Contraception:  PMP Menopausal hormone therapy:  n/a Last mammogram:  01/26/23 Breast Density Cat C, BI-RADS CAT 1 neg Last pap smear:   12/30/20 neg: HR HPV neg. 12/30/19 neg: HR HPV neg        OB History     Gravida  0   Para  0   Term  0   Preterm  0   AB  0   Living  0      SAB  0   IAB  0   Ectopic  0   Multiple  0   Live Births  0              Patient Active Problem List   Diagnosis Date Noted   Encounter for screening colonoscopy    High cholesterol 12/30/2019   Osteopenia    Hypothyroidism    Biliary dyskinesia    Internal hemorrhoids     Past Medical History:  Diagnosis Date   Biliary dyskinesia    per pt told and dx 1990s   Endometriosis    Heart murmur    refer to pcp dr Tanya Nones, note in epic 05-16-2021 stated in assessment no murmur heard// (02-24-2022 )per pt was told had MVP approx early 2000s or late 1990s, stated had echo done but has not had one since   Hyperlipidemia    Hypothyroidism    followed by pcp   Internal hemorrhoids    Osteopenia    PMB (postmenopausal bleeding)    Rosacea    Thickened endometrium    Wears contact lenses     Past Surgical History:  Procedure Laterality Date    APPENDECTOMY     child   COLONOSCOPY WITH PROPOFOL N/A 05/28/2015   Procedure: COLONOSCOPY WITH PROPOFOL;  Surgeon: Scot Jun, MD;  [facility] ENDOSCOPY;  Service: Endoscopy;  Laterality: N/A;   COLONOSCOPY WITH PROPOFOL N/A 08/03/2020   Procedure: COLONOSCOPY WITH PROPOFOL;  Surgeon: Midge Minium, MD;  Location: [facility] ENDOSCOPY;  Service: Endoscopy;  Laterality: N/A;   DILATATION & CURETTAGE/HYSTEROSCOPY WITH MYOSURE N/A 02/28/2022   Procedure: DILATATION & CURETTAGE/HYSTEROSCOPY WITH MYOSURE;  Surgeon: Romualdo Bolk, MD;  Location: [facility];  Service: Gynecology;  Laterality: N/A;   LAPAROSCOPIC SALPINGO OOPHERECTOMY Left 2002   ORIF WRIST FRACTURE Left 2012    Current Outpatient Medications  Medication Sig Dispense Refill   Calcium-Vitamin D (CALTRATE 600 PLUS-VIT D PO) Take by mouth daily. 2 chews     levothyroxine (SYNTHROID) 50 MCG tablet Take 1 tablet (50 mcg total) by mouth daily. Alternate daily with 75 mcg. 90 tablet 3   levothyroxine (SYNTHROID) 75 MCG tablet Take 1 tablet (75 mcg total) by mouth  daily. Alternate daily with 50 mcg. 90 tablet 3   lidocaine (XYLOCAINE) 5 % ointment Apply a pea sized amount topically 20 minutes prior to intercourse, then wipe it off just prior to intercourse 30 g 1   metroNIDAZOLE (METROCREAM) 0.75 % cream Apply to cheeks and nose every night for rosacea, twice a day if flared. 45 g 11   pravastatin (PRAVACHOL) 20 MG tablet TAKE 1 TABLET BY MOUTH EVERY DAY 90 tablet 0   Tavaborole (KERYDIN) 5 % SOLN Apply to toenail every night until improved. 10 mL 5   No current facility-administered medications for this visit.     ALLERGIES: Patient has no known allergies.  Family History  Problem Relation Age of Onset   Cancer Mother        lung   Intracerebral hemorrhage Father    Heart disease Father    Diabetes Father    Kidney disease Father    Osteoporosis Sister 44   Cancer Maternal Aunt        colon    Osteoporosis Maternal Aunt    Cancer Maternal Aunt        colon    Pancreatic cancer Paternal Aunt    Ovarian cancer Paternal Aunt 36   Pancreatic cancer Paternal Uncle    Heart disease Maternal Grandfather    Cancer Paternal Grandmother    Breast cancer Neg Hx     Social History   Socioeconomic History   Marital status: Married    Spouse name: Not on file   Number of children: Not on file   Years of education: Not on file   Highest education level: Not on file  Occupational History   Not on file  Tobacco Use   Smoking status: Never   Smokeless tobacco: Never  Vaping Use   Vaping status: Never Used  Substance and Sexual Activity   Alcohol use: Not Currently    Comment: very rare   Drug use: Never   Sexual activity: Yes    Partners: Male    Birth control/protection: Post-menopausal  Other Topics Concern   Not on file  Social History Narrative   Not on file   Social Determinants of Health   Financial Resource Strain: Not on file  Food Insecurity: Not on file  Transportation Needs: Not on file  Physical Activity: Not on file  Stress: Not on file  Social Connections: Not on file  Intimate Partner Violence: Not on file    Review of Systems  Genitourinary:  Positive for pelvic pain and vaginal discharge.    PHYSICAL EXAMINATION:    BP 132/84 (BP Location: Left Arm, Patient Position: Sitting, Cuff Size: Normal)   Pulse 77   Ht 5' 1.75" (1.568 m)   Wt 122 lb (55.3 kg)   SpO2 99%   BMI 22.50 kg/m     General appearance: alert, cooperative and appears stated age  Pelvic: External genitalia:  no lesions              Urethra:  normal appearing urethra with no masses, tenderness or lesions              Bartholins and Skenes: normal                 Vagina: normal appearing vagina with normal color and discharge, no lesions              Cervix: no lesions  Bimanual Exam:  Uterus:  normal size, contour, position, consistency, mobility,  non-tender. Thin right vaginal adhesion palpated with bimanual exam.               Adnexa: no mass, fullness, tenderness              Rectal exam: yes.  Confirms.              Anus:  normal sphincter tone, no lesions  Chaperone was present for exam:  Warren Lacy, CMA  ASSESSMENT  Vaginal adhesion. Postmenopausal bleeding.  Vaginal discharge. Cervical stenosis.  PLAN  Pap and HR HPV Vaginitis testing.  Pelvic US.  Consider doing lysis of adhesion in office.

## 2023-06-13 ENCOUNTER — Encounter: Payer: Self-pay | Admitting: Obstetrics and Gynecology

## 2023-06-13 ENCOUNTER — Telehealth: Payer: Self-pay | Admitting: Obstetrics and Gynecology

## 2023-06-13 ENCOUNTER — Ambulatory Visit: Payer: 59 | Admitting: Obstetrics and Gynecology

## 2023-06-13 ENCOUNTER — Other Ambulatory Visit (HOSPITAL_COMMUNITY)
Admission: RE | Admit: 2023-06-13 | Discharge: 2023-06-13 | Disposition: A | Discharge status: Home / Self Care | Payer: 59 | Source: Ambulatory Visit | Attending: Obstetrics and Gynecology | Admitting: Obstetrics and Gynecology

## 2023-06-13 VITALS — BP 132/84 | HR 77 | Ht 61.75 in | Wt 122.0 lb

## 2023-06-13 DIAGNOSIS — N950 Postmenopausal bleeding: Secondary | ICD-10-CM | POA: Diagnosis not present

## 2023-06-13 DIAGNOSIS — N898 Other specified noninflammatory disorders of vagina: Secondary | ICD-10-CM

## 2023-06-13 DIAGNOSIS — Z124 Encounter for screening for malignant neoplasm of cervix: Secondary | ICD-10-CM | POA: Diagnosis not present

## 2023-06-13 NOTE — Telephone Encounter (Signed)
Please schedule pelvic ultrasound for postmenopausal bleeding.  Hx of endometrial polyp.

## 2023-06-13 NOTE — Telephone Encounter (Signed)
Order placed for [place] and pt notified to contact [facility] outpatient imaging centralized scheduling desk for appt. # provided. Pt voiced understanding.

## 2023-06-14 NOTE — Telephone Encounter (Signed)
Scheduled for [place] on 06/20/2023. Routing to provider for final review and closing.

## 2023-06-20 ENCOUNTER — Ambulatory Visit
Admission: RE | Admit: 2023-06-20 | Discharge: 2023-06-20 | Disposition: A | Discharge status: Home / Self Care | Payer: 59 | Source: Ambulatory Visit | Attending: Obstetrics and Gynecology | Admitting: Obstetrics and Gynecology

## 2023-06-20 DIAGNOSIS — N939 Abnormal uterine and vaginal bleeding, unspecified: Secondary | ICD-10-CM | POA: Diagnosis not present

## 2023-06-20 DIAGNOSIS — N950 Postmenopausal bleeding: Secondary | ICD-10-CM | POA: Insufficient documentation

## 2023-06-21 ENCOUNTER — Other Ambulatory Visit: Payer: Self-pay | Admitting: Obstetrics and Gynecology

## 2023-06-21 DIAGNOSIS — N950 Postmenopausal bleeding: Secondary | ICD-10-CM

## 2023-06-27 ENCOUNTER — Telehealth: Payer: Self-pay

## 2023-06-27 NOTE — Telephone Encounter (Signed)
-----   Message from [place] S sent at 06/27/2023 10:35 AM EDT ----- done ----- Message ----- From: Melrose Nakayama, CMA Sent: 06/21/2023   3:19 PM EDT To: [facility]Gynecology Appointments  Please schedule pt for endo bx with BS. BS has placed order

## 2023-06-27 NOTE — Telephone Encounter (Signed)
Pt scheduled for endo bx 07/31/23

## 2023-07-14 ENCOUNTER — Other Ambulatory Visit: Payer: Self-pay | Admitting: Family Medicine

## 2023-07-16 NOTE — Telephone Encounter (Signed)
Requested Prescriptions  Pending Prescriptions Disp Refills   pravastatin (PRAVACHOL) 20 MG tablet [Pharmacy Med Name: PRAVASTATIN SODIUM 20 MG TAB] 90 tablet 3    Sig: TAKE 1 TABLET BY MOUTH EVERY DAY     Cardiovascular:  Antilipid - Statins Failed - 07/14/2023  9:22 AM      Failed - Valid encounter within last 12 months    Recent Outpatient Visits           2 years ago Routine general medical examination at a health care facility   [facility] Medicine Pickard, Priscille Heidelberg, MD   3 years ago Colon cancer screening   [facility] Medicine Donita Brooks, MD   4 years ago Postmenopausal estrogen deficiency   [facility] Medicine Tanya Nones, Priscille Heidelberg, MD   5 years ago Routine general medical examination at a health care facility   [facility] Pickard, Priscille Heidelberg, MD   6 years ago Routine general medical examination at a health care facility   [facility] Medicine Pickard, Priscille Heidelberg, MD              Failed - Lipid Panel in normal range within the last 12 months    Cholesterol  Date Value Ref Range Status  05/17/2023 186 <200 mg/dL Final   LDL Cholesterol (Calc)  Date Value Ref Range Status  05/17/2023 98 mg/dL (calc) Final    Comment:    Reference range: <100 . Desirable range <100 mg/dL for primary prevention;   <70 mg/dL for patients with CHD or diabetic patients  with > or = 2 CHD risk factors. Marland Kitchen LDL-C is now calculated using the Martin-Hopkins  calculation, which is a validated novel method providing  better accuracy than the Friedewald equation in the  estimation of LDL-C.  Horald Pollen et al. Lenox Ahr. 0454;098(11): 2061-2068  (http://education.[facility].com/faq/FAQ164)    HDL  Date Value Ref Range Status  05/17/2023 74 > OR = 50 mg/dL Final   Triglycerides  Date Value Ref Range Status  05/17/2023 56 <150 mg/dL Final         Passed - Patient is not pregnant

## 2023-07-18 ENCOUNTER — Encounter: Payer: Self-pay | Admitting: Obstetrics and Gynecology

## 2023-07-18 ENCOUNTER — Other Ambulatory Visit (HOSPITAL_COMMUNITY)
Admission: RE | Admit: 2023-07-18 | Discharge: 2023-07-18 | Disposition: A | Discharge status: Home / Self Care | Payer: 59 | Source: Ambulatory Visit | Attending: Obstetrics and Gynecology | Admitting: Obstetrics and Gynecology

## 2023-07-18 ENCOUNTER — Ambulatory Visit: Payer: 59 | Admitting: Obstetrics and Gynecology

## 2023-07-18 VITALS — BP 138/80 | HR 78 | Ht 61.75 in | Wt 122.0 lb

## 2023-07-18 DIAGNOSIS — N950 Postmenopausal bleeding: Secondary | ICD-10-CM | POA: Insufficient documentation

## 2023-07-18 NOTE — Patient Instructions (Signed)
 Endometrial Biopsy  An endometrial biopsy is a procedure where a tissue sample is removed from the lining of the uterus. This lining is called the endometrium. The tissue sample is then sent to a lab for testing. You may have this type of biopsy to check for: Cancer. Infection. Growths called polyps. Uterine bleeding that can't be explained. Tell a health care provider about: Any allergies you have. All medicines you're taking including vitamins, herbs, eye drops, creams, and over-the-counter medicines. Any problems you or family members have had with anesthesia. Any bleeding problems you have. Any surgeries you have had. Any medical problems you have. Whether you're pregnant or may be pregnant. What are the risks? Your health care provider will talk with you about risks. These may include: Bleeding. Infection. Allergic reactions to medicines. Damage to the wall of the uterus. This is rare. What happens before the procedure? Keep track of your period. You may need to have this biopsy when you're not having your period. Ask your provider about: Changing or stopping your regular medicines. These include any diabetes medicines or blood thinners you take. Taking medicines such as aspirin and ibuprofen. These medicines can thin your blood. Do not take them unless your provider tells you to. Taking over-the-counter medicines, vitamins, herbs, and supplements. Bring a pad with you. You may need to wear one after the biopsy. Plan to have a responsible adult take you home from the hospital or clinic. You won't be allowed to drive. What happens during the procedure? A tool will be put into your vagina to hold it open. This helps your provider see the cervix. The cervix is the lowest part of the uterus. Your cervix will be cleaned with a solution that kills germs. You will be given anesthesia. This keeps you from feeling pain. It will numb your cervix. A tool called forceps will be used to  hold your cervix steady. A thin tool called a uterine sound will be put through your cervix. It will be used to: Find the length of your uterus. Find where to take the sample from. A soft tube called a catheter will be put into your uterus. The catheter will remove a tissue sample. The tube and tools will be removed. The sample will be sent to a lab for testing. The procedure may vary among providers and hospitals. What happens after the procedure? Your blood pressure, heart rate, breathing rate, and blood oxygen level will be monitored until you leave the hospital or clinic. It's up to you to get the results of your procedure. Ask your provider, or the department that is doing the procedure, when your results will be ready. This information is not intended to replace advice given to you by your health care provider. Make sure you discuss any questions you have with your health care provider. Document Revised: 01/02/2023 Document Reviewed: 01/02/2023 Elsevier Patient Education  2024 [employer].

## 2023-07-18 NOTE — Progress Notes (Signed)
GYNECOLOGY  VISIT   HPI: 62 y.o.   Married  Caucasian  female   [id number] with No LMP recorded. Patient is postmenopausal.   here for   endometrial biopsy for postmenopausal bleeding.  She has a hx of a benign endometrial polyp removed with hysteroscopy 02/28/22.  She also has a known vaginal adhesion.  No further bleeding.   Study Result  Narrative & Impression  CLINICAL DATA:  Postmenopausal bleeding   EXAM: TRANSABDOMINAL AND TRANSVAGINAL ULTRASOUND OF PELVIS   TECHNIQUE: Both transabdominal and transvaginal ultrasound examinations of the pelvis were performed. Transabdominal technique was performed for global imaging of the pelvis including uterus, ovaries, adnexal regions, and pelvic cul-de-sac. It was necessary to proceed with endovaginal exam following the transabdominal exam to visualize the endometrium.   COMPARISON:  None Available.   FINDINGS: Uterus   Measurements: 5.8 x 1.8 x 2.9 cm = volume: 16.1 mL. No fibroids or other mass visualized.   Endometrium   Thickness: 4.2 mm.  No focal abnormality visualized.   Right ovary   Measurements: 0.8 x 1.0 x 0.6 cm = volume: 0.3 mL. Normal appearance/no adnexal mass.   Left ovary   Surgically absent   Other findings   No abnormal free fluid.   IMPRESSION: Endometrium measures 4.2 mm. In the setting of post-menopausal bleeding, this is consistent with a benign etiology such as endometrial atrophy. If bleeding remains unresponsive to hormonal or medical therapy, sonohysterogram should be considered for focal lesion work-up. (Ref: Radiological Reasoning: Algorithmic Workup of Abnormal Vaginal Bleeding with Endovaginal Sonography and Sonohysterography. AJR 2008; 098:J19-14)     Electronically Signed   By: Annia Belt M.D.   On: 06/20/2023 22:53    GYNECOLOGIC HISTORY: No LMP recorded. Patient is postmenopausal. Contraception:  PMP Menopausal hormone therapy:  n/a Last mammogram:  01/26/23 Breast  Density Cat C, BI-RADS CAT 1 neg  Last pap smear: 06/13/23 neg: HR HPV neg,   12/30/20 neg: HR HPV neg. 12/30/19 neg: HR HPV neg         OB History     Gravida  0   Para  0   Term  0   Preterm  0   AB  0   Living  0      SAB  0   IAB  0   Ectopic  0   Multiple  0   Live Births  0              Patient Active Problem List   Diagnosis Date Noted   Encounter for screening colonoscopy    High cholesterol 12/30/2019   Osteopenia    Hypothyroidism    Biliary dyskinesia    Internal hemorrhoids     Past Medical History:  Diagnosis Date   Biliary dyskinesia    per pt told and dx 1990s   Endometriosis    Heart murmur    refer to pcp dr Tanya Nones, note in epic 05-16-2021 stated in assessment no murmur heard// (02-24-2022 )per pt was told had MVP approx early 2000s or late 1990s, stated had echo done but has not had one since   Hyperlipidemia    Hypothyroidism    followed by pcp   Internal hemorrhoids    Osteopenia    PMB (postmenopausal bleeding)    Rosacea    Thickened endometrium    Wears contact lenses     Past Surgical History:  Procedure Laterality Date   APPENDECTOMY     child  COLONOSCOPY WITH PROPOFOL N/A 05/28/2015   Procedure: COLONOSCOPY WITH PROPOFOL;  Surgeon: Scot Jun, MD;  Location: [facility] ENDOSCOPY;  Service: Endoscopy;  Laterality: N/A;   COLONOSCOPY WITH PROPOFOL N/A 08/03/2020   Procedure: COLONOSCOPY WITH PROPOFOL;  Surgeon: Midge Minium, MD;  Location: [facility] ENDOSCOPY;  Service: Endoscopy;  Laterality: N/A;   DILATATION & CURETTAGE/HYSTEROSCOPY WITH MYOSURE N/A 02/28/2022   Procedure: DILATATION & CURETTAGE/HYSTEROSCOPY WITH MYOSURE;  Surgeon: Romualdo Bolk, MD;  Location: [facility];  Service: Gynecology;  Laterality: N/A;   LAPAROSCOPIC SALPINGO OOPHERECTOMY Left 2002   ORIF WRIST FRACTURE Left 2012    Current Outpatient Medications  Medication Sig Dispense Refill   Calcium-Vitamin D (CALTRATE 600 PLUS-VIT  D PO) Take by mouth daily. 2 chews     levothyroxine (SYNTHROID) 50 MCG tablet Take 1 tablet (50 mcg total) by mouth daily. Alternate daily with 75 mcg. 90 tablet 3   levothyroxine (SYNTHROID) 75 MCG tablet Take 1 tablet (75 mcg total) by mouth daily. Alternate daily with 50 mcg. 90 tablet 3   lidocaine (XYLOCAINE) 5 % ointment Apply a pea sized amount topically 20 minutes prior to intercourse, then wipe it off just prior to intercourse 30 g 1   metroNIDAZOLE (METROCREAM) 0.75 % cream Apply to cheeks and nose every night for rosacea, twice a day if flared. 45 g 11   pravastatin (PRAVACHOL) 20 MG tablet TAKE 1 TABLET BY MOUTH EVERY DAY 90 tablet 3   Tavaborole (KERYDIN) 5 % SOLN Apply to toenail every night until improved. 10 mL 5   No current facility-administered medications for this visit.     ALLERGIES: Patient has no known allergies.  Family History  Problem Relation Age of Onset   Cancer Mother        lung   Intracerebral hemorrhage Father    Heart disease Father    Diabetes Father    Kidney disease Father    Osteoporosis Sister 17   Cancer Maternal Aunt        colon   Osteoporosis Maternal Aunt    Cancer Maternal Aunt        colon    Pancreatic cancer Paternal Aunt    Ovarian cancer Paternal Aunt 78   Pancreatic cancer Paternal Uncle    Heart disease Maternal Grandfather    Cancer Paternal Grandmother    Breast cancer Neg Hx     Social History   Socioeconomic History   Marital status: Married    Spouse name: Not on file   Number of children: Not on file   Years of education: Not on file   Highest education level: Not on file  Occupational History   Not on file  Tobacco Use   Smoking status: Never   Smokeless tobacco: Never  Vaping Use   Vaping status: Never Used  Substance and Sexual Activity   Alcohol use: Not Currently    Comment: very rare   Drug use: Never   Sexual activity: Yes    Partners: Male    Birth control/protection: Post-menopausal  Other  Topics Concern   Not on file  Social History Narrative   Not on file   Social Determinants of Health   Financial Resource Strain: Not on file  Food Insecurity: Not on file  Transportation Needs: Not on file  Physical Activity: Not on file  Stress: Not on file  Social Connections: Not on file  Intimate Partner Violence: Not on file    Review of Systems  All other systems reviewed and are negative.   PHYSICAL EXAMINATION:    BP 138/80 (BP Location: Left Arm, Patient Position: Sitting, Cuff Size: Normal)   Pulse 78   Ht 5' 1.75" (1.568 m)   Wt 122 lb (55.3 kg)   SpO2 100%   BMI 22.50 kg/m     General appearance: alert, cooperative and appears stated age     Pelvic: External genitalia:  no lesions              Urethra:  normal appearing urethra with no masses, tenderness or lesions              Bartholins and Skenes: normal                 Vagina: normal appearing vagina with normal color and discharge, no lesions              Cervix: no lesions                Bimanual Exam:  Uterus:  normal size, contour, position, consistency, mobility, non-tender.  No vaginal adhesion noted.               Adnexa: no mass, fullness, tenderness        EMB: Consent for procedure. Sterile prep with   Paracervical block with 1% lidocaine 10 cc, lot number   [id number], expiration Jul 2026 Tenaculum to anterior cervical lip. Pipelle passed to     6     cm twice.   Tissue to pathology.  Limited specimen obtained. Minimal EBL. No complications.   Chaperone was present for exam:  Warren Lacy, CMA  ASSESSMENT  Postmenopausal bleeding.  Hx endometrial polyp.   PLAN  FU EMB.  If EMB shows polyp, I would recommend a sonohysterogram.  If limited EMB or benign result, no further evaluation unless has recurrent postmenopausal bleeding.  Annual exam due March, 2024.

## 2023-07-20 ENCOUNTER — Encounter: Payer: Self-pay | Admitting: Obstetrics and Gynecology

## 2023-07-20 LAB — SURGICAL PATHOLOGY

## 2023-07-20 NOTE — Telephone Encounter (Signed)
Routing to Dr. Marjorie Smolder.   Encounter closed.

## 2023-07-31 ENCOUNTER — Ambulatory Visit: Payer: 59 | Admitting: Obstetrics and Gynecology

## 2023-08-11 ENCOUNTER — Encounter: Payer: Self-pay | Admitting: Family Medicine

## 2023-08-12 ENCOUNTER — Other Ambulatory Visit: Payer: Self-pay | Admitting: Family Medicine

## 2023-08-12 DIAGNOSIS — Z1152 Encounter for screening for COVID-19: Secondary | ICD-10-CM | POA: Diagnosis not present

## 2023-08-12 DIAGNOSIS — Z6821 Body mass index (BMI) 21.0-21.9, adult: Secondary | ICD-10-CM | POA: Diagnosis not present

## 2023-08-12 DIAGNOSIS — R030 Elevated blood-pressure reading, without diagnosis of hypertension: Secondary | ICD-10-CM | POA: Diagnosis not present

## 2023-08-12 DIAGNOSIS — U071 COVID-19: Secondary | ICD-10-CM | POA: Diagnosis not present

## 2023-08-14 ENCOUNTER — Other Ambulatory Visit: Payer: Self-pay | Admitting: Family Medicine

## 2023-08-14 MED ORDER — NIRMATRELVIR/RITONAVIR (PAXLOVID)TABLET: 3.0000 | ORAL_TABLET | Freq: Two times a day (BID) | ORAL | 0 refills | Status: AC

## 2023-10-10 DIAGNOSIS — R040 Epistaxis: Secondary | ICD-10-CM | POA: Diagnosis not present

## 2023-10-10 DIAGNOSIS — J340 Abscess, furuncle and carbuncle of nose: Secondary | ICD-10-CM | POA: Diagnosis not present

## 2023-12-03 ENCOUNTER — Other Ambulatory Visit: Payer: Self-pay | Admitting: Family Medicine

## 2023-12-03 DIAGNOSIS — Z1231 Encounter for screening mammogram for malignant neoplasm of breast: Secondary | ICD-10-CM

## 2023-12-19 ENCOUNTER — Encounter: Payer: Self-pay | Admitting: Family Medicine

## 2023-12-19 ENCOUNTER — Other Ambulatory Visit: Payer: 59

## 2023-12-19 DIAGNOSIS — E038 Other specified hypothyroidism: Secondary | ICD-10-CM

## 2023-12-19 DIAGNOSIS — E7800 Pure hypercholesterolemia, unspecified: Secondary | ICD-10-CM

## 2023-12-19 DIAGNOSIS — N182 Chronic kidney disease, stage 2 (mild): Secondary | ICD-10-CM | POA: Diagnosis not present

## 2023-12-20 LAB — LIPID PANEL
Cholesterol: 188 mg/dL (ref <200)
HDL: 81 mg/dL (ref >=50)
LDL Cholesterol (Calc): 92 mg/dL
Non-HDL Cholesterol (Calc): 107 mg/dL (ref <130)
Total CHOL/HDL Ratio: 2.3 (calc) (ref <5.0)
Triglycerides: 64 mg/dL (ref <150)

## 2023-12-20 LAB — CBC WITH DIFFERENTIAL/PLATELET
Absolute Lymphocytes: 1894 {cells}/uL (ref 850–3900)
Absolute Monocytes: 416 {cells}/uL (ref 200–950)
Basophils Absolute: 62 {cells}/uL (ref 0–200)
Basophils Relative: 0.8 %
Eosinophils Absolute: 208 {cells}/uL (ref 15–500)
Eosinophils Relative: 2.7 %
HCT: 42.9 % (ref 35.0–45.0)
Hemoglobin: 14 g/dL (ref 11.7–15.5)
MCH: 27.8 pg (ref 27.0–33.0)
MCHC: 32.6 g/dL (ref 32.0–36.0)
MCV: 85.1 fL (ref 80.0–100.0)
MPV: 9.6 fL (ref 7.5–12.5)
Monocytes Relative: 5.4 %
Neutro Abs: 5121 {cells}/uL (ref 1500–7800)
Neutrophils Relative %: 66.5 %
Platelets: 368 10*3/uL (ref 140–400)
RBC: 5.04 10*6/uL (ref 3.80–5.10)
RDW: 13.5 % (ref 11.0–15.0)
Total Lymphocyte: 24.6 %
WBC: 7.7 10*3/uL (ref 3.8–10.8)

## 2023-12-20 LAB — COMPLETE METABOLIC PANEL WITH GFR
AG Ratio: 1.7 (calc) (ref 1.0–2.5)
ALT: 12 U/L (ref 6–29)
AST: 17 U/L (ref 10–35)
Albumin: 4.5 g/dL (ref 3.6–5.1)
Alkaline phosphatase (APISO): 98 U/L (ref 37–153)
BUN: 16 mg/dL (ref 7–25)
CO2: 28 mmol/L (ref 20–32)
Calcium: 9.7 mg/dL (ref 8.6–10.4)
Chloride: 106 mmol/L (ref 98–110)
Creat: 0.98 mg/dL (ref 0.50–1.05)
Globulin: 2.6 g/dL (ref 1.9–3.7)
Glucose, Bld: 90 mg/dL (ref 65–99)
Potassium: 4.5 mmol/L (ref 3.5–5.3)
Sodium: 143 mmol/L (ref 135–146)
Total Bilirubin: 0.4 mg/dL (ref 0.2–1.2)
Total Protein: 7.1 g/dL (ref 6.1–8.1)
eGFR: 65 mL/min/{1.73_m2} (ref >=60)

## 2023-12-20 LAB — TSH: TSH: 2.61 m[IU]/L (ref 0.40–4.50)

## 2023-12-24 ENCOUNTER — Ambulatory Visit (INDEPENDENT_AMBULATORY_CARE_PROVIDER_SITE_OTHER): Payer: 59 | Admitting: Dermatology

## 2023-12-24 ENCOUNTER — Encounter: Payer: Self-pay | Admitting: Dermatology

## 2023-12-24 DIAGNOSIS — W908XXA Exposure to other nonionizing radiation, initial encounter: Secondary | ICD-10-CM | POA: Diagnosis not present

## 2023-12-24 DIAGNOSIS — Z1283 Encounter for screening for malignant neoplasm of skin: Secondary | ICD-10-CM | POA: Diagnosis not present

## 2023-12-24 DIAGNOSIS — D1801 Hemangioma of skin and subcutaneous tissue: Secondary | ICD-10-CM | POA: Diagnosis not present

## 2023-12-24 DIAGNOSIS — B351 Tinea unguium: Secondary | ICD-10-CM

## 2023-12-24 DIAGNOSIS — L578 Other skin changes due to chronic exposure to nonionizing radiation: Secondary | ICD-10-CM | POA: Diagnosis not present

## 2023-12-24 DIAGNOSIS — L814 Other melanin hyperpigmentation: Secondary | ICD-10-CM

## 2023-12-24 DIAGNOSIS — L309 Dermatitis, unspecified: Secondary | ICD-10-CM

## 2023-12-24 DIAGNOSIS — L719 Rosacea, unspecified: Secondary | ICD-10-CM | POA: Diagnosis not present

## 2023-12-24 DIAGNOSIS — L821 Other seborrheic keratosis: Secondary | ICD-10-CM | POA: Diagnosis not present

## 2023-12-24 DIAGNOSIS — L570 Actinic keratosis: Secondary | ICD-10-CM | POA: Diagnosis not present

## 2023-12-24 DIAGNOSIS — D229 Melanocytic nevi, unspecified: Secondary | ICD-10-CM

## 2023-12-24 MED ORDER — TRIAMCINOLONE ACETONIDE 0.1 % EX CREA
1.0000 | TOPICAL_CREAM | CUTANEOUS | 1 refills | Status: AC
Start: 2023-12-24 — End: ?

## 2023-12-24 NOTE — Progress Notes (Signed)
Follow-Up Visit   Subjective  Natasha Hawkins is a 63 y.o. female who presents for the following: Skin Cancer Screening and Full Body Skin Exam, Rosacea face, not using Metronidazole 0.75% cr, scaly spots face ~60m, check spot back no symptoms  The patient presents for Total-Body Skin Exam (TBSE) for skin cancer screening and mole check. The patient has spots, moles and lesions to be evaluated, some may be new or changing and the patient may have concern these could be cancer.    The following portions of the chart were reviewed this encounter and updated as appropriate: medications, allergies, medical history  Review of Systems:  No other skin or systemic complaints except as noted in HPI or Assessment and Plan.  Objective  Well appearing patient in no apparent distress; mood and affect are within normal limits.  A full examination was performed including scalp, head, eyes, ears, nose, lips, neck, chest, axillae, abdomen, back, buttocks, bilateral upper extremities, bilateral lower extremities, hands, feet, fingers, toes, fingernails, and toenails. All findings within normal limits unless otherwise noted below.   Relevant physical exam findings are noted in the Assessment and Plan.  L spinal upper back x 1 Pink scaly macules  Assessment & Plan   SKIN CANCER SCREENING PERFORMED TODAY.  ACTINIC DAMAGE - Chronic condition, secondary to cumulative UV/sun exposure - diffuse scaly erythematous macules with underlying dyspigmentation - Recommend daily broad spectrum sunscreen SPF 30+ to sun-exposed areas, reapply every 2 hours as needed.  - Staying in the shade or wearing long sleeves, sun glasses (UVA+UVB protection) and wide brim hats (4-inch brim around the entire circumference of the hat) are also recommended for sun protection.  - Call for new or changing lesions.  LENTIGINES, SEBORRHEIC KERATOSES, HEMANGIOMAS - Benign normal skin lesions - Benign-appearing - Call for any  changes  MELANOCYTIC NEVI - Tan-brown and/or pink-flesh-colored symmetric macules and papules - Benign appearing on exam today - Observation - Call clinic for new or changing moles - Recommend daily use of broad spectrum spf 30+ sunscreen to sun-exposed areas.    ROSACEA face Exam Mid face erythema with telangiectasias   Chronic and persistent condition with duration or expected duration over one year. Currently not to goal, but not bothersome to patient   Rosacea is a chronic progressive skin condition usually affecting the face of adults, causing redness and/or acne bumps. It is treatable but not curable. It sometimes affects the eyes (ocular rosacea) as well. It may respond to topical and/or systemic medication and can flare with stress, sun exposure, alcohol, exercise, topical steroids (including hydrocortisone/cortisone 10) and some foods.  Daily application of broad spectrum spf 30+ sunscreen to face is recommended to reduce flares.  Patient denies grittiness of the eyes  Treatment Plan Patient declines Rx Metronidazole cr  SEBORRHEIC KERATOSIS L upper pretibia, L dorsal hand, R infraocular Exam:  L upper pretibia 1.3cm waxy tan patch L dorsal hand 7.24mm waxy gray brown speckled macule R infraocular brown waxy macule  Treatment Plan: Benign-appearing. Stable compared to previous visit. Observation.  Call clinic for new or changing moles.  Recommend daily use of broad spectrum spf 30+ sunscreen to sun-exposed areas.   HAND DERMATITIS hands Exam Erythema and scale bil thumbs with excoriations  Chronic and persistent condition with duration or expected duration over one year. Condition is bothersome/symptomatic for patient. Currently flared.  Hand Dermatitis is a chronic type of eczema that can come and go on the hands and fingers.  While there is  no cure, the rash and symptoms can be managed with topical prescription medications, and for more severe cases, with systemic  medications.  Recommend mild soap and routine use of moisturizing cream after handwashing.  Minimize soap/water exposure when possible.    Treatment Plan Start TMC 0.1% cr qd/bid until clear, then prn flares, avoid f/g/a Recommend mild soap and moisturizer with hand washing  Topical steroids (such as triamcinolone, fluocinolone, fluocinonide, mometasone, clobetasol, halobetasol, betamethasone, hydrocortisone) can cause thinning and lightening of the skin if they are used for too long in the same area. Your physician has selected the right strength medicine for your problem and area affected on the body. Please use your medication only as directed by your physician to prevent side effects.     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 Condition is symptomatic/ bothersome to patient. Not currently at goal.  Treatment Plan: Not bothersome to pt. Pt declines Rx, including Kerydin solution  AK (ACTINIC KERATOSIS) L spinal upper back x 1 Actinic keratoses are precancerous spots that appear secondary to cumulative UV radiation exposure/sun exposure over time. They are chronic with expected duration over 1 year. A portion of actinic keratoses will progress to squamous cell carcinoma of the skin. It is not possible to reliably predict which spots will progress to skin cancer and so treatment is recommended to prevent development of skin cancer.  Recommend daily broad spectrum sunscreen SPF 30+ to sun-exposed areas, reapply every 2 hours as needed.  Recommend staying in the shade or wearing long sleeves, sun glasses (UVA+UVB protection) and wide brim hats (4-inch brim around the entire circumference of the hat). Call for new or changing lesions. Destruction of lesion - L spinal upper back x 1  Destruction method: cryotherapy    Informed consent: discussed and consent obtained   Lesion destroyed using liquid nitrogen: Yes   Region frozen until ice ball extended beyond lesion: Yes   Outcome: patient tolerated procedure well with no complications   Post-procedure details: wound care instructions given   Additional details:  Prior to procedure, discussed risks of blister formation, small wound, skin dyspigmentation, or rare scar following cryotherapy. Recommend Vaseline ointment to treated areas while healing.  Return in about 1 year (around 12/23/2024) for TBSE, Hx of AKs.  I, Ardis Rowan, RMA, am acting as scribe for Willeen Niece, MD .   Documentation: I have reviewed the above documentation for accuracy and completeness, and I agree with the above.  Willeen Niece, MD

## 2023-12-24 NOTE — Patient Instructions (Addendum)

## 2024-01-28 ENCOUNTER — Ambulatory Visit
Admission: RE | Admit: 2024-01-28 | Discharge: 2024-01-28 | Disposition: A | Discharge status: Home / Self Care | Payer: 59 | Source: Ambulatory Visit | Attending: Family Medicine | Admitting: Family Medicine

## 2024-01-28 DIAGNOSIS — Z1231 Encounter for screening mammogram for malignant neoplasm of breast: Secondary | ICD-10-CM | POA: Insufficient documentation

## 2024-03-10 ENCOUNTER — Encounter: Payer: Self-pay | Admitting: Obstetrics and Gynecology

## 2024-03-10 ENCOUNTER — Ambulatory Visit (INDEPENDENT_AMBULATORY_CARE_PROVIDER_SITE_OTHER): Payer: 59 | Admitting: Obstetrics and Gynecology

## 2024-03-10 VITALS — BP 124/84 | HR 69 | Ht 62.25 in | Wt 121.0 lb

## 2024-03-10 DIAGNOSIS — Z809 Family history of malignant neoplasm, unspecified: Secondary | ICD-10-CM | POA: Diagnosis not present

## 2024-03-10 DIAGNOSIS — Z1331 Encounter for screening for depression: Secondary | ICD-10-CM | POA: Diagnosis not present

## 2024-03-10 DIAGNOSIS — Z01419 Encounter for gynecological examination (general) (routine) without abnormal findings: Secondary | ICD-10-CM | POA: Diagnosis not present

## 2024-03-10 DIAGNOSIS — N941 Unspecified dyspareunia: Secondary | ICD-10-CM

## 2024-03-10 MED ORDER — LIDOCAINE 5 % EX OINT
TOPICAL_OINTMENT | CUTANEOUS | 1 refills | Status: AC
Start: 2024-03-10 — End: ?

## 2024-03-10 NOTE — Patient Instructions (Addendum)
 Consider Good Clean Love or cooking oil for vaginal hydration and lubrication.   EXERCISE AND DIET:  We recommended that you start or continue a regular exercise program for good health. Regular exercise means any activity that makes your heart beat faster and makes you sweat.  We recommend exercising at least 30 minutes per day at least 3 days a week, preferably 4 or 5.  We also recommend a diet low in fat and sugar.  Inactivity, poor dietary choices and obesity can cause diabetes, heart attack, stroke, and kidney damage, among others.    ALCOHOL AND SMOKING:  Women should limit their alcohol intake to no more than 7 drinks/beers/glasses of wine (combined, not each!) per week. Moderation of alcohol intake to this level decreases your risk of breast cancer and liver damage. And of course, no recreational drugs are part of a healthy lifestyle.  And absolutely no smoking or even second hand smoke. Most people know smoking can cause heart and lung diseases, but did you know it also contributes to weakening of your bones? Aging of your skin?  Yellowing of your teeth and nails?  CALCIUM AND VITAMIN D:  Adequate intake of calcium and Vitamin D are recommended.  The recommendations for exact amounts of these supplements seem to change often, but generally speaking 600 mg of calcium (either carbonate or citrate) and 800 units of Vitamin D per day seems prudent. Certain women may benefit from higher intake of Vitamin D.  If you are among these women, your doctor will have told you during your visit.    PAP SMEARS:  Pap smears, to check for cervical cancer or precancers,  have traditionally been done yearly, although recent scientific advances have shown that most women can have pap smears less often.  However, every woman still should have a physical exam from her gynecologist every year. It will include a breast check, inspection of the vulva and vagina to check for abnormal growths or skin changes, a visual exam of  the cervix, and then an exam to evaluate the size and shape of the uterus and ovaries.  And after 64 years of age, a rectal exam is indicated to check for rectal cancers. We will also provide age appropriate advice regarding health maintenance, like when you should have certain vaccines, screening for sexually transmitted diseases, bone density testing, colonoscopy, mammograms, etc.   MAMMOGRAMS:  All women over 29 years old should have a yearly mammogram. Many facilities now offer a "3D" mammogram, which may cost around $50 extra out of pocket. If possible,  we recommend you accept the option to have the 3D mammogram performed.  It both reduces the number of women who will be called back for extra views which then turn out to be normal, and it is better than the routine mammogram at detecting truly abnormal areas.    COLONOSCOPY:  Colonoscopy to screen for colon cancer is recommended for all women at age 58.  We know, you hate the idea of the prep.  We agree, BUT, having colon cancer and not knowing it is worse!!  Colon cancer so often starts as a polyp that can be seen and removed at colonscopy, which can quite literally save your life!  And if your first colonoscopy is normal and you have no family history of colon cancer, most women don't have to have it again for 10 years.  Once every ten years, you can do something that may end up saving your life, right?  We will be happy to help you get it scheduled when you are ready.  Be sure to check your insurance coverage so you understand how much it will cost.  It may be covered as a preventative service at no cost, but you should check your particular policy.

## 2024-03-10 NOTE — Progress Notes (Signed)
 63 y.o. G0P0000 Married Caucasian female here for annual exam.    No further vaginal bleeding.   Wants refill on Lidocaine  to use on the vulva prior to intercourse.  She wipes it off.   Has vaginal dryness.  She prefers not to use vaginal estrogens.   PCP: Austine Lefort, MD   No LMP recorded. Patient is postmenopausal.           Sexually active: Yes.    The current method of family planning is post menopausal status.    Menopausal hormone therapy:  n/a Exercising: Yes.     Walking and swimming  Smoker:  no  OB History  Gravida Para Term Preterm AB Living  0 0 0 0 0 0  SAB IAB Ectopic Multiple Live Births  0 0 0 0 0     HEALTH MAINTENANCE: Last 2 paps:  06/13/23 neg, HR HPV neg, 12/30/20 neg, HR HPV neg  History of abnormal Pap or positive HPV:  no Mammogram:   01/28/24 BI-RADS Cat 1 neg  Colonoscopy:  08/03/20 - due in 2026 Bone Density:  06/15/22  Result  Osteopenia.  FRAX 13%/1.2%    Immunization History  Administered Date(s) Administered   Influenza Split 09/06/2013   PFIZER(Purple Top)SARS-COV-2 Vaccination 06/17/2020, 07/08/2020   Td 06/02/1992   Tdap 09/26/2012, 02/20/2023   Zoster Recombinant(Shingrix) 07/20/2022, 11/22/2022      reports that she has never smoked. She has never used smokeless tobacco. She reports that she does not currently use alcohol. She reports that she does not use drugs.  Past Medical History:  Diagnosis Date   Actinic keratosis    Biliary dyskinesia    per pt told and dx 1990s   Endometriosis    Heart murmur    refer to pcp dr pickard, note in epic 05-16-2021 stated in assessment no murmur heard// (02-24-2022 )per pt was told had MVP approx early 2000s or late 1990s, stated had echo done but has not had one since   Hyperlipidemia    Hypothyroidism    followed by pcp   Internal hemorrhoids    Osteopenia    PMB (postmenopausal bleeding)    Rosacea    Thickened endometrium    Wears contact lenses     Past Surgical History:   Procedure Laterality Date   APPENDECTOMY     child   COLONOSCOPY WITH PROPOFOL  N/A 05/28/2015   Procedure: COLONOSCOPY WITH PROPOFOL ;  Surgeon: Cassie Click, MD;  Location: [facility] ENDOSCOPY;  Service: Endoscopy;  Laterality: N/A;   COLONOSCOPY WITH PROPOFOL  N/A 08/03/2020   Procedure: COLONOSCOPY WITH PROPOFOL ;  Surgeon: Marnee Sink, MD;  Location: [facility] ENDOSCOPY;  Service: Endoscopy;  Laterality: N/A;   DILATATION & CURETTAGE/HYSTEROSCOPY WITH MYOSURE N/A 02/28/2022   Procedure: DILATATION & CURETTAGE/HYSTEROSCOPY WITH MYOSURE;  Surgeon: Wanita Gutta, MD;  Location: [facility];  Service: Gynecology;  Laterality: N/A;   LAPAROSCOPIC SALPINGO OOPHERECTOMY Left 2002   ORIF WRIST FRACTURE Left 2012    Current Outpatient Medications  Medication Sig Dispense Refill   Calcium-Vitamin D (CALTRATE 600 PLUS-VIT D PO) Take by mouth daily. 2 chews     levothyroxine  (SYNTHROID ) 50 MCG tablet Take 1 tablet (50 mcg total) by mouth daily. Alternate daily with 75 mcg. 90 tablet 3   levothyroxine  (SYNTHROID ) 75 MCG tablet TAKE 1 TABLET BY MOUTH EVERY DAY 90 tablet 1   lidocaine  (XYLOCAINE ) 5 % ointment Apply a pea sized amount topically 20 minutes prior to intercourse, then wipe  it off just prior to intercourse 30 g 1   metroNIDAZOLE  (METROCREAM ) 0.75 % cream Apply to cheeks and nose every night for rosacea, twice a day if flared. 45 g 11   pravastatin  (PRAVACHOL ) 20 MG tablet TAKE 1 TABLET BY MOUTH EVERY DAY 90 tablet 3   Tavaborole  (KERYDIN ) 5 % SOLN Apply to toenail every night until improved. 10 mL 5   triamcinolone  cream (KENALOG ) 0.1 % Apply 1 Application topically as directed. qd to bid aa dermatitis on hands until clear, then prn flares, avoid face, groin, axilla 45 g 1   No current facility-administered medications for this visit.    ALLERGIES: Patient has no known allergies.  Family History  Problem Relation Age of Onset   Cancer Mother        lung    Intracerebral hemorrhage Father    Heart disease Father    Diabetes Father    Kidney disease Father    Osteoporosis Sister 27   Cancer Maternal Aunt        colon   Osteoporosis Maternal Aunt    Cancer Maternal Aunt        colon    Pancreatic cancer Paternal Aunt    Ovarian cancer Paternal Aunt 35   Pancreatic cancer Paternal Uncle    Heart disease Maternal Grandfather    Cancer Paternal Grandmother    Breast cancer Neg Hx     Review of Systems  All other systems reviewed and are negative.   PHYSICAL EXAM:  BP 124/84 (BP Location: Left Arm, Patient Position: Sitting, Cuff Size: Normal)   Pulse 69   Ht 5' 2.25" (1.581 m)   Wt 121 lb (54.9 kg)   SpO2 96%   BMI 21.95 kg/m     General appearance: alert, cooperative and appears stated age Head: normocephalic, without obvious abnormality, atraumatic Neck: no adenopathy, supple, symmetrical, trachea midline and thyroid  normal to inspection and palpation Lungs: clear to auscultation bilaterally Breasts: normal appearance, no masses or tenderness, No nipple retraction or dimpling, No nipple discharge or bleeding, No axillary adenopathy Heart: regular rate and rhythm Abdomen: soft, non-tender; no masses, no organomegaly Extremities: extremities normal, atraumatic, no cyanosis or edema Skin: skin color, texture, turgor normal. No rashes or lesions Lymph nodes: cervical, supraclavicular, and axillary nodes normal. Neurologic: grossly normal  Pelvic: External genitalia:  no lesions              No abnormal inguinal nodes palpated.              Urethra:  normal appearing urethra with no masses, tenderness or lesions              Bartholins and Skenes: normal                 Vagina: normal appearing vagina with normal color and discharge, no lesions              Cervix: no lesions              Pap taken: no Bimanual Exam:  Uterus:  normal size, contour, position, consistency, mobility, non-tender              Adnexa: no mass,  fullness, tenderness              Rectal exam: yes.  Confirms.              Anus:  normal sphincter tone, no lesions  Chaperone was present for exam:   Jada  M. CMA  ASSESSMENT: Well woman visit with gynecologic exam. Status post laparoscopic LSO - pain.  Vaginal atrophy.  Osteopenia.  Depression screening.  PHQ-9: 0 FH cancers:  pancreatic, ovarian on paternal side and colon on maternal side.  PLAN: Mammogram screening discussed. Self breast awareness reviewed. Pap and HRV collected:  no.  Due in 2029. Guidelines for Calcium, Vitamin D, regular exercise program including cardiovascular and weight bearing exercise. Medication refills:  Lidocaine  refill.   We discussed vaginal vit E products, Good Clean Love, and cooking oils for vaginal atrophy.   Labs with PCP.  BMD due this fall.  PCP following.  Referral for genetic counseling and possible testing.  Follow up:  annually and prn.

## 2024-05-01 ENCOUNTER — Encounter: Payer: Self-pay | Admitting: Genetic Counselor

## 2024-05-01 ENCOUNTER — Inpatient Hospital Stay: Admitting: Genetic Counselor

## 2024-05-01 ENCOUNTER — Inpatient Hospital Stay

## 2024-05-01 DIAGNOSIS — Z800 Family history of malignant neoplasm of digestive organs: Secondary | ICD-10-CM | POA: Diagnosis not present

## 2024-05-01 DIAGNOSIS — Z8041 Family history of malignant neoplasm of ovary: Secondary | ICD-10-CM | POA: Diagnosis not present

## 2024-05-01 DIAGNOSIS — Z8052 Family history of malignant neoplasm of bladder: Secondary | ICD-10-CM | POA: Diagnosis not present

## 2024-05-01 NOTE — Progress Notes (Signed)
 REFERRING PROVIDER: Duanne Butler DASEN, MD [address] [place],  [place] [address]  PRIMARY PROVIDER:  Duanne Butler DASEN, MD  PRIMARY REASON FOR VISIT:  Encounter Diagnoses  Name Primary?   Family history of colon cancer Yes   Family history of ovarian cancer    Family history of pancreatic cancer    Family history of bladder cancer    HISTORY OF PRESENT ILLNESS:   Ms. Rivers, a 63 y.o. female, was seen for a [place] cancer genetics consultation at the request of Dr. Duanne due to a family history of cancer.  Ms. Curfman presents to clinic today to discuss the possibility of a hereditary predisposition to cancer, to discuss genetic testing, and to further clarify her future cancer risks, as well as potential cancer risks for family members.   Ms. Presas is a 63 y.o. female with no personal history of cancer.    CANCER HISTORY:  Oncology History   No history exists.    RISK FACTORS:  Menarche was at age 32.  First live birth at age n/.  Ovaries intact: one.  Uterus intact: yes.  Menopausal status: postmenopausal HRT use: 0 years. Colonoscopy: yes; normal. Mammogram within the last year: yes. Any excessive radiation exposure in the past: no  Past Medical History:  Diagnosis Date   Actinic keratosis    Biliary dyskinesia    per pt told and dx 1990s   Endometriosis    Heart murmur    refer to pcp dr pickard, note in epic 05-16-2021 stated in assessment no murmur heard// (02-24-2022 )per pt was told had MVP approx early 2000s or late 1990s, stated had echo done but has not had one since   Hyperlipidemia    Hypothyroidism    followed by pcp   Internal hemorrhoids    Osteopenia    PMB (postmenopausal bleeding)    Rosacea    Thickened endometrium    Wears contact lenses     Past Surgical History:  Procedure Laterality Date   APPENDECTOMY     child   COLONOSCOPY WITH PROPOFOL  N/A 05/28/2015   Procedure: COLONOSCOPY WITH PROPOFOL ;  Surgeon: Lamar DASEN Holmes,  MD;  Location: [facility] ENDOSCOPY;  Service: Endoscopy;  Laterality: N/A;   COLONOSCOPY WITH PROPOFOL  N/A 08/03/2020   Procedure: COLONOSCOPY WITH PROPOFOL ;  Surgeon: Jinny Carmine, MD;  Location: [facility] ENDOSCOPY;  Service: Endoscopy;  Laterality: N/A;   DILATATION & CURETTAGE/HYSTEROSCOPY WITH MYOSURE N/A 02/28/2022   Procedure: DILATATION & CURETTAGE/HYSTEROSCOPY WITH MYOSURE;  Surgeon: Jannis Kate Norris, MD;  Location: [facility] ;  Service: Gynecology;  Laterality: N/A;   LAPAROSCOPIC SALPINGO OOPHERECTOMY Left 2002   ORIF WRIST FRACTURE Left 2012    Social History   Socioeconomic History   Marital status: Married    Spouse name: Not on file   Number of children: Not on file   Years of education: Not on file   Highest education level: Not on file  Occupational History   Not on file  Tobacco Use   Smoking status: Never   Smokeless tobacco: Never  Vaping Use   Vaping status: Never Used  Substance and Sexual Activity   Alcohol use: Not Currently    Comment: very rare   Drug use: Never   Sexual activity: Yes    Partners: Male    Birth control/protection: Post-menopausal  Other Topics Concern   Not on file  Social History Narrative   Not on file   Social Drivers of Health  Financial Resource Strain: Not on file  Food Insecurity: Not on file  Transportation Needs: Not on file  Physical Activity: Not on file  Stress: Not on file  Social Connections: Not on file     FAMILY HISTORY:  We obtained a detailed, 4-generation family history.  Significant diagnoses are listed below: Family History  Problem Relation Age of Onset   Lung cancer Mother 38   Intracerebral hemorrhage Father    Heart disease Father    Diabetes Father    Kidney disease Father    Osteoporosis Sister 21   Colon cancer Maternal Aunt 14 - 79   Osteoporosis Maternal Aunt    Colon cancer Maternal Aunt 60 - 69   Colon cancer Maternal Uncle 53 - 79   Pancreatic cancer Paternal Aunt     Ovarian cancer Paternal Aunt 76 - 79   Pancreatic cancer Paternal Uncle 107 - 79   Heart disease Maternal Grandfather    Lung cancer Paternal Grandfather    Bladder Cancer Maternal Cousin 41 - 79   Bladder Cancer Maternal Cousin 4 - 79   Kidney cancer Maternal Cousin 64 - 69       agent orange   Breast cancer Neg Hx        Ms. Lasseigne is unaware of previous family history of genetic testing for hereditary cancer risks. There is no reported Ashkenazi Jewish ancestry. We have uploaded her pedigree.   GENETIC COUNSELING ASSESSMENT: Ms. Carlson is a 63 y.o. female with a family history of cancer which is somewhat suggestive of a hereditary predisposition to cancer. We, therefore, discussed and recommended the following at today's visit.   DISCUSSION: We discussed that 5 - 10% of cancer is hereditary, with most cases of hereditary ovarian and pancreatic cancer associated with BRCA1/2 and most colon cancer associated with Lynch Syndrome.  There are other genes that can be associated with hereditary cancer syndromes.  We discussed that testing is beneficial for several reasons, including knowing about other cancer risks, identifying potential screening and risk-reduction options that may be appropriate, and to understanding if other family members could be at risk for cancer and allowing them to undergo genetic testing.  We reviewed the characteristics, features and inheritance patterns of hereditary cancer syndromes. We also discussed genetic testing, including the appropriate family members to test, the process of testing, insurance coverage and turn-around-time for results. We discussed the implications of a negative, positive, carrier and/or variant of uncertain significant result. We discussed that negative results would be uninformative given that Ms. Tissue does not have a personal history of cancer. We recommended Ms. Arriaga pursue genetic testing for a panel that contains genes associated with  cancer.  Based on Ms. Estelle's family history of cancer, she meets medical criteria for genetic testing. Though Ms. Duce is not personally affected, there are no affected family members that are willing/able to undergo hereditary cancer testing. Despite that she meets criteria, she may still have an out of pocket cost. We discussed that if her out of pocket cost for testing is over $100, the laboratory should contact them to discuss self-pay prices, patient pay assistance programs, if applicable, and other billing options.  We discussed that some people do not want to undergo genetic testing due to fear of genetic discrimination.  A federal law called the Genetic Information Non-Discrimination Act (GINA) of 2008 helps protect individuals against genetic discrimination based on their genetic test results.  It impacts both health insurance and employment.  With  health insurance, it protects against increased premiums, being kicked off insurance or being forced to take a test in order to be insured.  For employment it protects against hiring, firing and promoting decisions based on genetic test results.  GINA does not apply to those in the [employer], those who work for companies with less than 15 employees, and new life insurance or long-term disability insurance policies.  Health status due to a cancer diagnosis is not protected under GINA.  PLAN: Despite our recommendation, Ms. Hochman did not wish to pursue genetic testing at today's visit. We understand this decision and remain available to coordinate genetic testing at any time in the future. We, therefore, recommend Ms. Krinke continue to follow the cancer screening guidelines given by her primary healthcare provider.  Ms. Ewell questions were answered to her satisfaction today. Our contact information was provided should additional questions or concerns arise. Thank you for the referral and allowing us  to share in the care of your patient.   Bijou Easler, MS, [facility] Genetic Counselor [email or web address] (P) [phone]  60 minutes were spent on the date of the encounter in service to the patient including preparation, face-to-face consultation, documentation and care coordination.  The patient brought her sister.  Drs. Gudena and/or Lanny were available to discuss this case as needed.  _______________________________________________________________________ For Office Staff:  Number of people involved in session: 2 Was an Intern/ student involved with case: no

## 2024-05-12 ENCOUNTER — Telehealth: Payer: Self-pay

## 2024-05-12 NOTE — Telephone Encounter (Signed)
 Copied from CRM [id number]. Topic: Clinical - Request for Lab/Test Order >> May 12, 2024 12:14 PM Natasha Hawkins wrote: Reason for CRM: The patient called in stating she normally gets her blood work done the week before her physical and her physical in scheduled for next Friday so she would like an order to be put on for this Friday 7/11 and to be scheduled for the labs when the orders have been placed. Please assist patient further as she would like to discuss these results with her provider at her appointment on July 18th

## 2024-05-16 ENCOUNTER — Other Ambulatory Visit

## 2024-05-16 DIAGNOSIS — Z780 Asymptomatic menopausal state: Secondary | ICD-10-CM

## 2024-05-16 DIAGNOSIS — E038 Other specified hypothyroidism: Secondary | ICD-10-CM | POA: Diagnosis not present

## 2024-05-16 DIAGNOSIS — N182 Chronic kidney disease, stage 2 (mild): Secondary | ICD-10-CM

## 2024-05-16 DIAGNOSIS — M8580 Other specified disorders of bone density and structure, unspecified site: Secondary | ICD-10-CM | POA: Diagnosis not present

## 2024-05-16 DIAGNOSIS — E7800 Pure hypercholesterolemia, unspecified: Secondary | ICD-10-CM

## 2024-05-17 LAB — TSH: TSH: 0.76 m[IU]/L (ref 0.40–4.50)

## 2024-05-17 LAB — LIPID PANEL
Cholesterol: 187 mg/dL (ref <200)
HDL: 75 mg/dL (ref >=50)
LDL Cholesterol (Calc): 98 mg/dL
Non-HDL Cholesterol (Calc): 112 mg/dL (ref <130)
Total CHOL/HDL Ratio: 2.5 (calc) (ref <5.0)
Triglycerides: 53 mg/dL (ref <150)

## 2024-05-19 ENCOUNTER — Ambulatory Visit: Payer: Self-pay | Admitting: Family Medicine

## 2024-05-23 ENCOUNTER — Encounter: Payer: Self-pay | Admitting: Family Medicine

## 2024-05-23 ENCOUNTER — Ambulatory Visit (INDEPENDENT_AMBULATORY_CARE_PROVIDER_SITE_OTHER): Admitting: Family Medicine

## 2024-05-23 VITALS — BP 124/70 | HR 78 | Temp 97.9°F | Ht 62.25 in | Wt 119.0 lb

## 2024-05-23 DIAGNOSIS — Z0001 Encounter for general adult medical examination with abnormal findings: Secondary | ICD-10-CM | POA: Diagnosis not present

## 2024-05-23 DIAGNOSIS — E7800 Pure hypercholesterolemia, unspecified: Secondary | ICD-10-CM | POA: Diagnosis not present

## 2024-05-23 DIAGNOSIS — E038 Other specified hypothyroidism: Secondary | ICD-10-CM | POA: Diagnosis not present

## 2024-05-23 DIAGNOSIS — Z0000 Encounter for general adult medical examination without abnormal findings: Secondary | ICD-10-CM

## 2024-05-23 MED ORDER — LEVOTHYROXINE SODIUM 75 MCG PO TABS: 75.0000 ug | ORAL_TABLET | Freq: Every day | ORAL | 3 refills | Status: AC

## 2024-05-23 MED ORDER — PRAVASTATIN SODIUM 20 MG PO TABS: 20.0000 mg | ORAL_TABLET | Freq: Every day | ORAL | 3 refills | Status: AC

## 2024-05-23 MED ORDER — LEVOTHYROXINE SODIUM 50 MCG PO TABS: 50.0000 ug | ORAL_TABLET | Freq: Every day | ORAL | 3 refills | Status: AC

## 2024-05-23 NOTE — Progress Notes (Signed)
 Subjective:    Patient ID: Natasha Hawkins, female    DOB: 10-22-1961, 63 y.o.   MRN: [id number]  HPI Patient is a 63 year old Caucasian female here today for complete physical exam.  Her last colonoscopy was in 2021 and they recommended repeating that again in 2026.  Her Pap smear is done through her gynecologist.  Had mammogram March 2025 that was normal.  Immunization History  Administered Date(s) Administered   Influenza Split 09/06/2013   PFIZER(Purple Top)SARS-COV-2 Vaccination 06/17/2020, 07/08/2020   Td 06/02/1992   Tdap 09/26/2012, 02/20/2023   Zoster Recombinant(Shingrix) 07/20/2022, 11/22/2022  She is due for the RSV vaccine.  She is due for the pneumonia vaccine.  She is due for COVID and flu in the fall.  We discussed this.  Her review of systems is negative.  She denies any chest pain or shortness of breath but she is interested in possibly stopping her cholesterol medicine.  Her cholesterol recently on her lab work was outstanding.  Please see lab work below Lab on 05/16/2024  Component Date Value Ref Range Status   TSH 05/16/2024 0.76  0.40 - 4.50 mIU/L Final   Cholesterol 05/16/2024 187  <200 mg/dL Final   HDL 92/88/7974 75  > OR = 50 mg/dL Final   Triglycerides 92/88/7974 53  <150 mg/dL Final   LDL Cholesterol (Calc) 05/16/2024 98  mg/dL (calc) Final   Comment: Reference range: <100 . Desirable range <100 mg/dL for primary prevention;   <70 mg/dL for patients with CHD or diabetic patients  with > or = 2 CHD risk factors. SABRA LDL-C is now calculated using the Martin-Hopkins  calculation, which is a validated novel method providing  better accuracy than the Friedewald equation in the  estimation of LDL-C.  Gladis APPLETHWAITE et al. SANDREA. 7986;689(80): 2061-2068  (http://education.[facility].com/faq/FAQ164)    Total CHOL/HDL Ratio 05/16/2024 2.5  <4.9 (calc) Final   Non-HDL Cholesterol (Calc) 05/16/2024 112  <130 mg/dL (calc) Final   Comment: For patients with diabetes  plus 1 major ASCVD risk  factor, treating to a non-HDL-C goal of <100 mg/dL  (LDL-C of <29 mg/dL) is considered a therapeutic  option.      Past Medical History:  Diagnosis Date   Actinic keratosis    Biliary dyskinesia    per pt told and dx 1990s   Endometriosis    Heart murmur    refer to pcp dr Kavya Haag, note in epic 05-16-2021 stated in assessment no murmur heard// (02-24-2022 )per pt was told had MVP approx early 2000s or late 1990s, stated had echo done but has not had one since   Hyperlipidemia    Hypothyroidism    followed by pcp   Internal hemorrhoids    Osteopenia    PMB (postmenopausal bleeding)    Rosacea    Thickened endometrium    Wears contact lenses    Past Surgical History:  Procedure Laterality Date   APPENDECTOMY     child   COLONOSCOPY WITH PROPOFOL  N/A 05/28/2015   Procedure: COLONOSCOPY WITH PROPOFOL ;  Surgeon: Lamar ONEIDA Holmes, MD;  Location: [facility] ENDOSCOPY;  Service: Endoscopy;  Laterality: N/A;   COLONOSCOPY WITH PROPOFOL  N/A 08/03/2020   Procedure: COLONOSCOPY WITH PROPOFOL ;  Surgeon: Jinny Carmine, MD;  Location: [facility] ENDOSCOPY;  Service: Endoscopy;  Laterality: N/A;   DILATATION & CURETTAGE/HYSTEROSCOPY WITH MYOSURE N/A 02/28/2022   Procedure: DILATATION & CURETTAGE/HYSTEROSCOPY WITH MYOSURE;  Surgeon: Jannis Kate Norris, MD;  Location: [facility];  Service: Gynecology;  Laterality: N/A;  LAPAROSCOPIC SALPINGO OOPHERECTOMY Left 2002   ORIF WRIST FRACTURE Left 2012   Current Outpatient Medications on File Prior to Visit  Medication Sig Dispense Refill   Calcium-Vitamin D (CALTRATE 600 PLUS-VIT D PO) Take by mouth daily. 2 chews     levothyroxine  (SYNTHROID ) 50 MCG tablet Take 1 tablet (50 mcg total) by mouth daily. Alternate daily with 75 mcg. 90 tablet 3   levothyroxine  (SYNTHROID ) 75 MCG tablet TAKE 1 TABLET BY MOUTH EVERY DAY 90 tablet 1   lidocaine  (XYLOCAINE ) 5 % ointment Apply a pea sized amount topically 20 minutes prior to  intercourse, then wipe it off just prior to intercourse 30 g 1   metroNIDAZOLE  (METROCREAM ) 0.75 % cream Apply to cheeks and nose every night for rosacea, twice a day if flared. 45 g 11   pravastatin  (PRAVACHOL ) 20 MG tablet TAKE 1 TABLET BY MOUTH EVERY DAY 90 tablet 3   Tavaborole  (KERYDIN ) 5 % SOLN Apply to toenail every night until improved. 10 mL 5   triamcinolone  cream (KENALOG ) 0.1 % Apply 1 Application topically as directed. qd to bid aa dermatitis on hands until clear, then prn flares, avoid face, groin, axilla 45 g 1   No current facility-administered medications on file prior to visit.   No Known Allergies Social History   Socioeconomic History   Marital status: Married    Spouse name: Not on file   Number of children: Not on file   Years of education: Not on file   Highest education level: Bachelor's degree (e.g., BA, AB, BS)  Occupational History   Not on file  Tobacco Use   Smoking status: Never   Smokeless tobacco: Never  Vaping Use   Vaping status: Never Used  Substance and Sexual Activity   Alcohol use: Not Currently    Comment: very rare   Drug use: Never   Sexual activity: Yes    Partners: Male    Birth control/protection: Post-menopausal  Other Topics Concern   Not on file  Social History Narrative   Not on file   Social Drivers of Health   Financial Resource Strain: Low Risk  (05/22/2024)   Overall Financial Resource Strain (CARDIA)    Difficulty of Paying Living Expenses: Not hard at all  Food Insecurity: No Food Insecurity (05/22/2024)   Hunger Vital Sign    Worried About Running Out of Food in the Last Year: Never true    Ran Out of Food in the Last Year: Never true  Transportation Needs: No Transportation Needs (05/22/2024)   PRAPARE - Administrator, Civil Service (Medical): No    Lack of Transportation (Non-Medical): No  Physical Activity: Sufficiently Active (05/22/2024)   Exercise Vital Sign    Days of Exercise per Week: 4 days     Minutes of Exercise per Session: 60 min  Stress: No Stress Concern Present (05/22/2024)   [employer] of Occupational Health - Occupational Stress Questionnaire    Feeling of Stress: Not at all  Social Connections: Socially Integrated (05/22/2024)   Social Connection and Isolation Panel    Frequency of Communication with Friends and Family: More than three times a week    Frequency of Social Gatherings with Friends and Family: Three times a week    Attends Religious Services: More than 4 times per year    Active Member of Clubs or Organizations: Yes    Attends Banker Meetings: More than 4 times per year    Marital Status: Married  Intimate Partner Violence: Not on file   Family History  Problem Relation Age of Onset   Lung cancer Mother 24   Intracerebral hemorrhage Father    Heart disease Father    Diabetes Father    Kidney disease Father    Osteoporosis Sister 43   Colon cancer Maternal Aunt 68 - 79   Osteoporosis Maternal Aunt    Colon cancer Maternal Aunt 60 - 69   Colon cancer Maternal Uncle 39 - 79   Pancreatic cancer Paternal Aunt    Ovarian cancer Paternal Aunt 36 - 79   Pancreatic cancer Paternal Uncle 20 - 79   Heart disease Maternal Grandfather    Lung cancer Paternal Grandfather    Bladder Cancer Maternal Cousin 45 - 79   Bladder Cancer Maternal Cousin 62 - 79   Kidney cancer Maternal Cousin 72 - 69       agent orange   Breast cancer Neg Hx       Review of Systems     Objective:   Physical Exam Vitals reviewed.  Constitutional:      General: She is not in acute distress.    Appearance: Normal appearance. She is normal weight. She is not ill-appearing, toxic-appearing or diaphoretic.  HENT:     Head: Normocephalic and atraumatic.     Right Ear: Tympanic membrane, ear canal and external ear normal. There is no impacted cerumen.     Left Ear: Tympanic membrane, ear canal and external ear normal. There is no impacted cerumen.     Nose:  Nose normal. No congestion.     Mouth/Throat:     Mouth: Mucous membranes are moist.     Pharynx: Oropharynx is clear. No oropharyngeal exudate or posterior oropharyngeal erythema.  Eyes:     General:        Right eye: No discharge.        Left eye: No discharge.     Extraocular Movements: Extraocular movements intact.     Conjunctiva/sclera: Conjunctivae normal.     Pupils: Pupils are equal, round, and reactive to light.  Neck:     Vascular: No carotid bruit.  Cardiovascular:     Rate and Rhythm: Normal rate and regular rhythm.     Pulses: Normal pulses.     Heart sounds: Normal heart sounds. No murmur heard.    No friction rub. No gallop.  Pulmonary:     Effort: Pulmonary effort is normal. No respiratory distress.     Breath sounds: Normal breath sounds. No stridor. No wheezing, rhonchi or rales.  Abdominal:     General: Abdomen is flat. Bowel sounds are normal. There is no distension.     Palpations: Abdomen is soft. There is no mass.     Tenderness: There is no abdominal tenderness. There is no guarding.     Hernia: No hernia is present.  Musculoskeletal:        General: No swelling or deformity. Normal range of motion.     Cervical Hawkins: Normal range of motion and neck supple. No rigidity.     Right lower leg: No edema.     Left lower leg: No edema.  Lymphadenopathy:     Cervical: No cervical adenopathy.  Skin:    General: Skin is warm.     Coloration: Skin is not jaundiced.     Findings: No bruising, erythema, lesion or rash.  Neurological:     General: No focal deficit present.     Mental Status:  She is alert and oriented to person, place, and time. Mental status is at baseline.     Cranial Nerves: No cranial nerve deficit.     Sensory: No sensory deficit.     Motor: No weakness.     Coordination: Coordination normal.     Gait: Gait normal.     Deep Tendon Reflexes: Reflexes normal.        Assessment & Plan:   Pure hypercholesterolemia - Plan: CT CARDIAC  SCORING (SELF PAY ONLY)  Routine general medical examination at a health care facility  Other specified hypothyroidism Patient's physical exam today is completely normal.  We will schedule the patient for coronary artery calcium score.  If her score is very low, we plan to stop her cholesterol medication.  Mammogram is up-to-date.  Bone density test is due next year if the patient has osteopenia.  Her last bone density test was 2 years ago.  Colonoscopy is due next year.  I recommended the pneumonia vaccine along with a flu shot and a shingles shot in the fall.  Patient defers vaccinations today.

## 2024-06-27 ENCOUNTER — Ambulatory Visit
Admission: RE | Admit: 2024-06-27 | Discharge: 2024-06-27 | Disposition: A | Discharge status: Home / Self Care | Source: Ambulatory Visit | Attending: Family Medicine | Admitting: Family Medicine

## 2024-06-27 DIAGNOSIS — E7800 Pure hypercholesterolemia, unspecified: Secondary | ICD-10-CM | POA: Insufficient documentation

## 2024-06-30 ENCOUNTER — Ambulatory Visit: Payer: Self-pay | Admitting: Family Medicine

## 2024-09-16 ENCOUNTER — Telehealth: Payer: Self-pay

## 2024-09-16 NOTE — Telephone Encounter (Signed)
 Ladies, would you be able to advise her what insurance works better with our office? Thank you!  Copied from CRM [id number]. Topic: General - Other >> Sep 16, 2024 10:48 AM Ivette P wrote: Reason for CRM:  Pt in enrollment and would like to know which insurance is easier to deal with and will be easier for the office. If someone can reach out to pt to help decide. Pt would like to go with her best option. And would like some advice.

## 2024-12-22 ENCOUNTER — Ambulatory Visit: Admitting: Dermatology

## 2024-12-22 ENCOUNTER — Ambulatory Visit: Payer: 59 | Admitting: Dermatology

## 2025-03-12 ENCOUNTER — Ambulatory Visit: Admitting: Obstetrics and Gynecology

## 2025-05-19 ENCOUNTER — Other Ambulatory Visit

## 2025-05-26 ENCOUNTER — Encounter: Admitting: Family Medicine
